# Patient Record
Sex: Female | Born: 1955
Health system: Southern US, Community
[De-identification: ages and names within clinical notes are randomized; demographics above are authoritative.]

## PROBLEM LIST (undated history)

## (undated) DIAGNOSIS — I10 Essential (primary) hypertension: Secondary | ICD-10-CM

## (undated) DIAGNOSIS — D649 Anemia, unspecified: Secondary | ICD-10-CM

## (undated) DIAGNOSIS — F419 Anxiety disorder, unspecified: Secondary | ICD-10-CM

## (undated) DIAGNOSIS — F17200 Nicotine dependence, unspecified, uncomplicated: Secondary | ICD-10-CM

## (undated) DIAGNOSIS — E785 Hyperlipidemia, unspecified: Secondary | ICD-10-CM

## (undated) HISTORY — DX: Anxiety disorder, unspecified: F41.9

## (undated) HISTORY — PX: TONSILLECTOMY AND ADENOIDECTOMY: SHX28

## (undated) HISTORY — DX: Nicotine dependence, unspecified, uncomplicated: F17.200

## (undated) HISTORY — PX: ABDOMINAL HYSTERECTOMY: SHX81

## (undated) HISTORY — PX: COLONOSCOPY: SHX174

## (undated) HISTORY — DX: Anemia, unspecified: D64.9

## (undated) HISTORY — DX: Hyperlipidemia, unspecified: E78.5

---

## 2002-02-07 ENCOUNTER — Other Ambulatory Visit: Admission: RE | Admit: 2002-02-07 | Discharge: 2002-02-07 | Payer: Self-pay | Admitting: Obstetrics and Gynecology

## 2002-02-14 ENCOUNTER — Encounter: Payer: Self-pay | Admitting: Obstetrics and Gynecology

## 2002-02-14 ENCOUNTER — Encounter: Admission: RE | Admit: 2002-02-14 | Discharge: 2002-02-14 | Payer: Self-pay | Admitting: Obstetrics and Gynecology

## 2002-02-16 ENCOUNTER — Encounter: Admission: RE | Admit: 2002-02-16 | Discharge: 2002-02-16 | Payer: Self-pay | Admitting: Obstetrics and Gynecology

## 2002-02-16 ENCOUNTER — Encounter: Payer: Self-pay | Admitting: Obstetrics and Gynecology

## 2003-03-09 ENCOUNTER — Encounter: Admission: RE | Admit: 2003-03-09 | Discharge: 2003-03-09 | Payer: Self-pay | Admitting: Obstetrics and Gynecology

## 2003-03-09 ENCOUNTER — Encounter: Payer: Self-pay | Admitting: Obstetrics and Gynecology

## 2003-03-27 ENCOUNTER — Other Ambulatory Visit: Admission: RE | Admit: 2003-03-27 | Discharge: 2003-03-27 | Payer: Self-pay | Admitting: Obstetrics and Gynecology

## 2003-05-08 ENCOUNTER — Encounter (INDEPENDENT_AMBULATORY_CARE_PROVIDER_SITE_OTHER): Payer: Self-pay | Admitting: *Deleted

## 2003-05-08 ENCOUNTER — Observation Stay (HOSPITAL_COMMUNITY): Admission: RE | Admit: 2003-05-08 | Discharge: 2003-05-09 | Payer: Self-pay | Admitting: Obstetrics and Gynecology

## 2004-03-27 ENCOUNTER — Encounter: Admission: RE | Admit: 2004-03-27 | Discharge: 2004-03-27 | Payer: Self-pay | Admitting: Obstetrics and Gynecology

## 2005-04-28 ENCOUNTER — Encounter: Admission: RE | Admit: 2005-04-28 | Discharge: 2005-04-28 | Payer: Self-pay | Admitting: Obstetrics and Gynecology

## 2006-05-03 ENCOUNTER — Encounter: Admission: RE | Admit: 2006-05-03 | Discharge: 2006-05-03 | Payer: Self-pay | Admitting: Obstetrics and Gynecology

## 2007-05-16 ENCOUNTER — Encounter: Admission: RE | Admit: 2007-05-16 | Discharge: 2007-05-16 | Payer: Self-pay | Admitting: Obstetrics and Gynecology

## 2008-06-25 ENCOUNTER — Encounter: Admission: RE | Admit: 2008-06-25 | Discharge: 2008-06-25 | Payer: Self-pay | Admitting: Obstetrics and Gynecology

## 2009-07-09 ENCOUNTER — Encounter: Admission: RE | Admit: 2009-07-09 | Discharge: 2009-07-09 | Payer: Self-pay | Admitting: Obstetrics and Gynecology

## 2010-09-15 ENCOUNTER — Other Ambulatory Visit: Payer: Self-pay | Admitting: Obstetrics and Gynecology

## 2010-09-15 DIAGNOSIS — Z1231 Encounter for screening mammogram for malignant neoplasm of breast: Secondary | ICD-10-CM

## 2010-09-16 ENCOUNTER — Ambulatory Visit
Admission: RE | Admit: 2010-09-16 | Discharge: 2010-09-16 | Disposition: A | Discharge status: Home / Self Care | Payer: PRIVATE HEALTH INSURANCE | Source: Ambulatory Visit | Attending: Obstetrics and Gynecology | Admitting: Obstetrics and Gynecology

## 2010-09-16 DIAGNOSIS — Z1231 Encounter for screening mammogram for malignant neoplasm of breast: Secondary | ICD-10-CM

## 2010-09-17 ENCOUNTER — Ambulatory Visit: Payer: Self-pay

## 2010-10-17 NOTE — H&P (Signed)
NAME:  Lindsay Collier, Lindsay Collier                          ACCOUNT NO.:  [id number]   MEDICAL RECORD NO.:  [id number]                   PATIENT TYPE:  OBV   LOCATION:  NA                                   FACILITY:  [facility]   PHYSICIAN:  Zenaida Niece, M.D.             DATE OF BIRTH:  1955/12/30   DATE OF ADMISSION:  05/08/2003  DATE OF DISCHARGE:                                HISTORY & PHYSICAL   CHIEF COMPLAINT:  Menometrorrhagia with dyspareunia and uterine leiomyomas.   HISTORY OF PRESENT ILLNESS:  This is a 55 year old black female para 1, 0-0-  0-1, whom I have been following  with annual examinations. At her most  recent  annual examination  in October of this year, she complained of  menses every 1 to 2 months that were slightly heavy with occasional  intramenstrual bleeding. She also has deep dyspareunia. On physical  examination her uterus was  upper limits of normal size, slightly irregular  and nontender. All surgical options were discussed  with the patient and she  wishes to proceed with definitive surgical therapy for her bleeding and  myomas. She did have an ultrasound last September which revealed 2 small  uterine myomas measuring up to 3 cm and physiologic ovarian cysts.   OBSTETRIC HISTORY:  Cesarean section at term with mechanical obstruction  due to leiomyomas, and she did have a myomectomy at that time as well.   PAST MEDICAL HISTORY:  Hypertension.   PAST SURGICAL HISTORY:  1. Tonsillectomy.  2. Cesarean section with myomectomy.   CURRENT MEDICATIONS:  HCTZ and Mavik.   ALLERGIES:  CODEINE.   SOCIAL HISTORY:  The patient is married and  does smoke greater than a pack  of  cigarettes a day.   FAMILY HISTORY:  The patient's father had colon cancer.   REVIEW OF SYSTEMS:  No urinary or bowel complaints.   PHYSICAL EXAMINATION:  GENERAL:  Weight 124 pounds. This is a well  developed, well nourished black female in no acute distress.  VITAL SIGNS:  Blood pressure  ranged from 110 to 140/70 to 90, pulse was 80.  NECK:  Supple without lymphadenopathy or thyromegaly.  LUNGS:  Clear to auscultation.  HEART:  Regular rate and rhythm without murmur.  ABDOMEN:  Soft, nontender, nondistended without palpable masses and she does  have a well healed transverse scar.  PELVIC:  External genitalia reveals no lesions. Speculum examination  reveals a normal cervix and Pap smear has returned normal. Bimanual  examination reveals a midplanar uterus which is upper  limits of normal  size, slightly irregular and fairly mobile.  EXTREMITIES:  No edema  and  nontender.   ASSESSMENT:  Symptomatic leiomyomatous uterus with menometrorrhagia and no  dyspareunia. All options have been discussed with the patient and she wished  to proceed with definitive surgical therapy. The risks of surgery including  bleeding, infection and damage to surrounding  organs have been discussed  with the patient as well as permanent sterility.   PLAN:  Admit the patient for laparoscopic assisted vaginal hysterectomy with  bilateral salpingo-oophorectomy and cystoscopy.                                               Zenaida Niece, M.D.    TDM/MEDQ  D:  05/07/2003  T:  05/07/2003  Job:  [id number]

## 2010-10-17 NOTE — Op Note (Signed)
NAME:  Lindsay Collier, Lindsay Collier                          ACCOUNT NO.:  [id number]   MEDICAL RECORD NO.:  [id number]                   PATIENT TYPE:  OBV   LOCATION:  9399                                 FACILITY:  [facility]   PHYSICIAN:  Zenaida Niece, M.D.             DATE OF BIRTH:  December 03, 1955   DATE OF PROCEDURE:  05/08/2003  DATE OF DISCHARGE:                                 OPERATIVE REPORT   PREOPERATIVE DIAGNOSIS:  Symptomatic leiomyomatous uterus.   POSTOPERATIVE DIAGNOSIS:  Symptomatic leiomyomatous uterus.   PROCEDURE:  Laparoscopically-assisted vaginal hysterectomy with bilateral  salpingo-oophorectomy and cystoscopy.   SURGEON:  Zenaida Niece, M.D.   ASSISTANT:  Huel Cote, M.D.   ANESTHESIA:  General endotracheal tube.   ESTIMATED BLOOD LOSS:  150 mL.   FINDINGS:  Enlarged uterus with multiple small myomas.  She had normal tubes  and ovaries.  She had a normal appendix, liver edge, and gallbladder.   PROCEDURE IN DETAIL:  The patient was taken to the operating room and placed  in the supine position.  General anesthesia was induced, and she was placed  in the mobile stirrups.  The abdomen, perineum, and vagina were prepped and  draped in the usual sterile fashion, bladder drained with a red rubber  catheter, and a Hulka tenaculum applied to the cervix for uterine  manipulation.  Infraumbilical skin was then infiltrated with 0.25% Marcaine  and a 1.5 cm horizontal incision was made.  The Veress needle was inserted  into the peritoneal cavity and placement confirmed by the water drop test  and opening pressure of 3 mmHg.  CO2 gas was insufflated to a pressure of  and the Veress needle was removed.  The 10/11 disposable trocar was  then introduced and placement confirmed by the laparoscope.  Five-millimeter  ports were placed on each side under direct visualization.  Inspection  revealed the above-mentioned findings, and the uterus was mobile with  several  small fibroids.  Using the tripolar device, each infundibulopelvic  ligament was coagulated with bipolar cautery and cut.  This was taken down  through the mesosalpinx, the round ligaments and broad ligaments to a level  just above the uterine artery.  This was done on both sides with adequate  hemostasis to free up tubes and ovaries and upper uterus.  From the left  side going across the anterior peritoneum to the right side, anterior  peritoneum was incised and the bladder pushed inferior.  All pedicles were  inspected and found to be hemostatic.   Attention was then turned vaginally.  The legs were elevated in the  stirrups.  She did have a fairly narrow vaginal canal.  Deaver retractors  were used and the anterior lip of the cervix was grasped with a Jacobs  tenaculum.  The cervicovaginal mucosa was infiltrated with a dilute solution  of Pitressin and incised circumferentially with electrocautery.  Using sharp  and blunt dissection the vagina was pushed off anteriorly and the anterior  peritoneum entered and the Deaver retractor used to retract the bladder  anterior.  Bowels were seen through this window.  The posterior cul-de-sac  was then grasped and entered sharply and a Bonnano speculum placed into the  posterior cul-de-sac.  The uterosacral ligaments were clamped, transected,  and ligated on each side with #1 chromic and tagged for later use.  Uterine  arteries were clamped, transected, and ligated on each side with #1 chromic.  Small pedicles were left, and these were clamped, transected, and ligated on  each side with 1 chromic and the uterus was removed.  The left tube and  ovary stayed attached to the uterus, but the right tube and ovary was  included in the prior pedicle.  It was then freed up and removed and sent as  a specimen as well.  Sutures were needed on each side to maintain hemostasis  and these were figure-of-eight sutures of #1 chromic.  The remainder of the   pedicles were hemostatic.  The uterosacral ligaments were plicated in the  midline with 2-0 silk.  The vagina was then closed in a vertical fashion  running locked 2-0 Vicryl.   Attention was then turned to cystoscopy.  The patient was given indigo  carmine IM.  A 70 degree cystoscope was inserted and after a couple of  minutes, indigo carmine was seen to come from each ureteral orifice without  difficulty.  There was no obvious injury to the bladder.  The cystoscope was  removed and a Foley catheter placed.  Legs were then taken down in the  stirrups.   Attention was turned back to laparoscopy.  The scrub tech and myself changed  gloves.  The laparoscope was reinserted and the gas allowed to inflate the  abdomen.  The pelvis was inspected and all pedicles found to be hemostatic.  The 5 mm ports were removed under direct visualization.  All gas was allowed  to deflate from the abdomen and then the 10/11 disposable trocar was then  removed.  The skin incisions were closed with interrupted sutures of 4-0  Vicryl, followed by Steri-Strips and Band-Aids.  The 5 mm port on the right  required through-and-through incision sutures to control a small amount of  bleeding.  The patient tolerated the procedure well and was taken to the  recovery room in stable condition.  Counts were correct x2, she was given  Ancef 1 g prior to the procedure, and she had PAS hose on throughout the  procedure.                                               Zenaida Niece, M.D.    TDM/MEDQ  D:  05/08/2003  T:  05/09/2003  Job:  [id number]

## 2010-10-21 ENCOUNTER — Ambulatory Visit (HOSPITAL_COMMUNITY)
Admission: RE | Admit: 2010-10-21 | Discharge: 2010-10-21 | Disposition: A | Discharge status: Home / Self Care | Payer: PRIVATE HEALTH INSURANCE | Source: Ambulatory Visit | Attending: Family Medicine | Admitting: Family Medicine

## 2010-10-21 ENCOUNTER — Other Ambulatory Visit: Payer: Self-pay | Admitting: Family Medicine

## 2010-10-21 ENCOUNTER — Encounter (HOSPITAL_COMMUNITY): Payer: Self-pay

## 2010-10-21 DIAGNOSIS — T148XXA Other injury of unspecified body region, initial encounter: Secondary | ICD-10-CM

## 2010-10-21 DIAGNOSIS — R55 Syncope and collapse: Secondary | ICD-10-CM | POA: Insufficient documentation

## 2010-10-21 HISTORY — DX: Essential (primary) hypertension: I10

## 2010-10-22 ENCOUNTER — Ambulatory Visit (HOSPITAL_COMMUNITY)
Admission: RE | Admit: 2010-10-22 | Discharge: 2010-10-22 | Disposition: A | Discharge status: Home / Self Care | Payer: PRIVATE HEALTH INSURANCE | Source: Ambulatory Visit | Attending: Family Medicine | Admitting: Family Medicine

## 2010-10-22 ENCOUNTER — Other Ambulatory Visit: Payer: Self-pay | Admitting: Family Medicine

## 2010-10-22 DIAGNOSIS — R42 Dizziness and giddiness: Secondary | ICD-10-CM | POA: Insufficient documentation

## 2010-10-22 DIAGNOSIS — R55 Syncope and collapse: Secondary | ICD-10-CM

## 2010-10-22 LAB — CREATININE, SERUM
Creatinine, Ser: 0.79 mg/dL (ref 0.4–1.2)
GFR calc non Af Amer: >60 mL/min (ref >60)

## 2010-10-22 MED ORDER — GADOBENATE DIMEGLUMINE 529 MG/ML IV SOLN
10.0000 mL | Freq: Once | INTRAVENOUS | Status: AC
  Administered 2010-10-22: 10 mL via INTRAVENOUS

## 2010-10-23 ENCOUNTER — Ambulatory Visit (HOSPITAL_COMMUNITY)
Admission: RE | Admit: 2010-10-23 | Discharge: 2010-10-23 | Disposition: A | Discharge status: Home / Self Care | Payer: PRIVATE HEALTH INSURANCE | Source: Ambulatory Visit | Attending: Family Medicine | Admitting: Family Medicine

## 2010-10-23 DIAGNOSIS — I6529 Occlusion and stenosis of unspecified carotid artery: Secondary | ICD-10-CM | POA: Insufficient documentation

## 2010-10-23 DIAGNOSIS — I10 Essential (primary) hypertension: Secondary | ICD-10-CM | POA: Insufficient documentation

## 2010-10-23 DIAGNOSIS — F17200 Nicotine dependence, unspecified, uncomplicated: Secondary | ICD-10-CM | POA: Insufficient documentation

## 2010-10-23 DIAGNOSIS — R42 Dizziness and giddiness: Secondary | ICD-10-CM | POA: Insufficient documentation

## 2010-10-23 DIAGNOSIS — R55 Syncope and collapse: Secondary | ICD-10-CM | POA: Insufficient documentation

## 2010-10-23 DIAGNOSIS — G459 Transient cerebral ischemic attack, unspecified: Secondary | ICD-10-CM

## 2010-12-15 ENCOUNTER — Other Ambulatory Visit: Payer: Self-pay | Admitting: Gastroenterology

## 2010-12-15 LAB — HM COLONOSCOPY

## 2011-08-10 ENCOUNTER — Other Ambulatory Visit: Payer: Self-pay | Admitting: Internal Medicine

## 2011-08-10 DIAGNOSIS — Z1231 Encounter for screening mammogram for malignant neoplasm of breast: Secondary | ICD-10-CM

## 2011-09-21 ENCOUNTER — Other Ambulatory Visit: Payer: Self-pay | Admitting: Physician Assistant

## 2011-09-22 NOTE — Telephone Encounter (Signed)
Then needs ov

## 2011-09-28 ENCOUNTER — Ambulatory Visit
Admission: RE | Admit: 2011-09-28 | Discharge: 2011-09-28 | Disposition: A | Discharge status: Home / Self Care | Payer: PRIVATE HEALTH INSURANCE | Source: Ambulatory Visit | Attending: Internal Medicine | Admitting: Internal Medicine

## 2011-09-28 DIAGNOSIS — Z1231 Encounter for screening mammogram for malignant neoplasm of breast: Secondary | ICD-10-CM

## 2011-09-30 ENCOUNTER — Ambulatory Visit (INDEPENDENT_AMBULATORY_CARE_PROVIDER_SITE_OTHER): Payer: PRIVATE HEALTH INSURANCE | Admitting: Physician Assistant

## 2011-09-30 ENCOUNTER — Encounter: Payer: Self-pay | Admitting: Physician Assistant

## 2011-09-30 VITALS — BP 120/74 | HR 76 | Temp 98.1°F | Resp 16 | Ht 63.5 in | Wt 115.6 lb

## 2011-09-30 DIAGNOSIS — Z01419 Encounter for gynecological examination (general) (routine) without abnormal findings: Secondary | ICD-10-CM

## 2011-09-30 DIAGNOSIS — I10 Essential (primary) hypertension: Secondary | ICD-10-CM | POA: Insufficient documentation

## 2011-09-30 DIAGNOSIS — F411 Generalized anxiety disorder: Secondary | ICD-10-CM

## 2011-09-30 DIAGNOSIS — E7800 Pure hypercholesterolemia, unspecified: Secondary | ICD-10-CM | POA: Insufficient documentation

## 2011-09-30 LAB — POCT URINALYSIS DIPSTICK
Bilirubin, UA: NEGATIVE
Ketones, UA: NEGATIVE
Protein, UA: NEGATIVE
Urobilinogen, UA: 0.2

## 2011-09-30 LAB — COMPREHENSIVE METABOLIC PANEL
AST: 25 U/L (ref 0–37)
Albumin: 4.4 g/dL (ref 3.5–5.2)
CO2: 27 mEq/L (ref 19–32)
Calcium: 10.1 mg/dL (ref 8.4–10.5)
Creat: 0.62 mg/dL (ref 0.50–1.10)
Glucose, Bld: 82 mg/dL (ref 70–99)
Sodium: 141 mEq/L (ref 135–145)

## 2011-09-30 LAB — LIPID PANEL
Cholesterol: 257 mg/dL — ABNORMAL HIGH (ref 0–200)
HDL: 45 mg/dL (ref >39)
Total CHOL/HDL Ratio: 5.7 Ratio
Triglycerides: 160 mg/dL — ABNORMAL HIGH (ref <150)
VLDL: 32 mg/dL (ref 0–40)

## 2011-09-30 LAB — CBC WITH DIFFERENTIAL/PLATELET
Basophils Relative: 1 % (ref 0–1)
Eosinophils Absolute: 0.2 10*3/uL (ref 0.0–0.7)
Eosinophils Relative: 2 % (ref 0–5)
HCT: 43.3 % (ref 36.0–46.0)
Hemoglobin: 14.4 g/dL (ref 12.0–15.0)
Lymphocytes Relative: 14 % (ref 12–46)
Lymphs Abs: 1.1 10*3/uL (ref 0.7–4.0)
MCV: 102.4 fL — ABNORMAL HIGH (ref 78.0–100.0)
Monocytes Absolute: 0.6 10*3/uL (ref 0.1–1.0)
Monocytes Relative: 8 % (ref 3–12)
RDW: 13.8 % (ref 11.5–15.5)
WBC: 7.5 10*3/uL (ref 4.0–10.5)

## 2011-09-30 MED ORDER — PAROXETINE HCL 20 MG PO TABS: 30.0000 mg | ORAL_TABLET | ORAL | Status: DC

## 2011-09-30 MED ORDER — ALPRAZOLAM 0.25 MG PO TABS: ORAL_TABLET | ORAL | Status: DC

## 2011-09-30 MED ORDER — LISINOPRIL-HYDROCHLOROTHIAZIDE 20-25 MG PO TABS: 1.0000 | ORAL_TABLET | Freq: Every day | ORAL | Status: DC

## 2011-09-30 NOTE — Progress Notes (Signed)
  Subjective:    Patient ID: Lindsay Collier, female    DOB: Oct 20, 1955, 56 y.o.   MRN: [id number]  HPI 56 yo AAF presents for pap smear, BP, cholesterol, anxiety check up.  Brings with her a log of blood pressures which are very well controlled.  She definitely is still experiencing anxiety daily when she works.  It is better since we have started the paxil, but she finds that she does better at work and can focus more if she takes an alprazolam in the mornings that she works. UTD on MMG and colonoscopy Review of Systems  All other systems reviewed and are negative.       Objective:   Physical Exam  Nursing note and vitals reviewed. Constitutional: She is oriented to person, place, and time. She appears well-developed and well-nourished.  HENT:  Head: Normocephalic and atraumatic.  Cardiovascular: Normal rate, regular rhythm and normal heart sounds.   Pulmonary/Chest: Effort normal. Right breast exhibits no inverted nipple, no mass, no nipple discharge, no skin change and no tenderness. Left breast exhibits no inverted nipple, no mass, no nipple discharge, no skin change and no tenderness. Breasts are symmetrical.  Abdominal: Soft. Bowel sounds are normal.  Genitourinary: Vagina normal.       No cervi s/p complete hysterectomy.  Bimanual unremarkable  Lymphadenopathy:    She has no axillary adenopathy.       Right: No supraclavicular adenopathy present.       Left: No supraclavicular adenopathy present.  Neurological: She is alert and oriented to person, place, and time.  Skin: Skin is warm and dry.  Psychiatric: She has a normal mood and affect. Her behavior is normal.   Results for orders placed in visit on 09/30/11  POCT URINALYSIS DIPSTICK      Component Value Range   Color, UA yellow     Clarity, UA clear     Glucose, UA neg     Bilirubin, UA neg     Ketones, UA neg     Spec Grav, UA 1.015     Blood, UA trace     pH, UA 7.0     Protein, UA neg     Urobilinogen, UA 0.2       Nitrite, UA neg     Leukocytes, UA Negative         Assessment & Plan:  Gyn exam-pap 1 sent htn-well controlled-continue current medication Anxiety-improved, but suboptimal control.  Increase paxil to 30mg  daily. This may help reduce the need for Xananx, but if not, we did discuss the r/b of daily xanax use/chemical dependence properties,etc and it is ok for her to use the morning dose daily and the afternoon dose daily prn.  She is to call me when the prescription is getting low.  She has been very responsible with her meds. If doing well, f/up in 6 months

## 2011-10-05 LAB — PAP IG (IMAGE GUIDED)

## 2011-10-07 ENCOUNTER — Encounter: Payer: Self-pay | Admitting: Family Medicine

## 2011-10-19 ENCOUNTER — Other Ambulatory Visit: Payer: Self-pay | Admitting: Physician Assistant

## 2011-10-21 ENCOUNTER — Other Ambulatory Visit: Payer: Self-pay | Admitting: Physician Assistant

## 2011-10-24 ENCOUNTER — Other Ambulatory Visit: Payer: Self-pay | Admitting: Physician Assistant

## 2011-11-21 ENCOUNTER — Other Ambulatory Visit: Payer: Self-pay | Admitting: Physician Assistant

## 2012-01-21 ENCOUNTER — Other Ambulatory Visit: Payer: Self-pay | Admitting: Family Medicine

## 2012-01-21 ENCOUNTER — Other Ambulatory Visit: Payer: Self-pay | Admitting: Physician Assistant

## 2012-01-21 MED ORDER — ALPRAZOLAM 0.25 MG PO TABS: ORAL_TABLET | ORAL | Status: DC

## 2012-04-14 ENCOUNTER — Telehealth: Payer: Self-pay

## 2012-04-14 ENCOUNTER — Other Ambulatory Visit: Payer: Self-pay | Admitting: Physician Assistant

## 2012-04-14 NOTE — Telephone Encounter (Signed)
PT STATES SHE HAVE BEEN TRYING TO GET HER ALPRAZOLAM REFILLED AND HASN'T HEARD ANYONE. PLEASE CALL [phone]     [facility] [address]

## 2012-04-14 NOTE — Telephone Encounter (Signed)
[facility] called [facility] [address]. Patient notified and voiced understanding.

## 2012-04-19 ENCOUNTER — Other Ambulatory Visit: Payer: Self-pay | Admitting: Physician Assistant

## 2012-04-20 ENCOUNTER — Other Ambulatory Visit: Payer: Self-pay | Admitting: Physician Assistant

## 2012-06-21 ENCOUNTER — Other Ambulatory Visit: Payer: Self-pay | Admitting: Physician Assistant

## 2012-06-23 ENCOUNTER — Other Ambulatory Visit: Payer: Self-pay | Admitting: Physician Assistant

## 2012-07-19 ENCOUNTER — Other Ambulatory Visit: Payer: Self-pay | Admitting: Physician Assistant

## 2012-07-24 ENCOUNTER — Other Ambulatory Visit: Payer: Self-pay | Admitting: Physician Assistant

## 2012-07-25 ENCOUNTER — Telehealth: Payer: Self-pay

## 2012-07-25 ENCOUNTER — Other Ambulatory Visit: Payer: Self-pay | Admitting: Physician Assistant

## 2012-07-25 NOTE — Telephone Encounter (Signed)
Both rx'es were sent on 07/24/12. If the pharmacy has not received them can we call them in.

## 2012-07-25 NOTE — Telephone Encounter (Signed)
Patient's husband states that [facility] refills of [facility]axil and lisinopril for patient a few days ago. They have not heard from [place] and patient is completely out of medication. I see requests in the mailbox if we could get to that as soon as possible per patient's request.

## 2012-08-15 ENCOUNTER — Other Ambulatory Visit: Payer: Self-pay | Admitting: Family Medicine

## 2012-08-15 ENCOUNTER — Other Ambulatory Visit: Payer: Self-pay | Admitting: Physician Assistant

## 2012-08-15 NOTE — Telephone Encounter (Signed)
Needs OV, second notice

## 2012-08-16 NOTE — Telephone Encounter (Signed)
Alprazolam refill called to pharmacy. 

## 2012-08-16 NOTE — Telephone Encounter (Signed)
Spoke w/pt to check when she plans to f/up because overdue according to OV notes. Pt stated she didn't realize she needed to f/up in 6 mos - thought it was just every year. She agreed to check calendar and CB to set up appt. Dr Audria Nine, do you want to RF alprazolam for anther month?

## 2012-08-31 ENCOUNTER — Encounter: Payer: PRIVATE HEALTH INSURANCE | Admitting: Physician Assistant

## 2012-09-10 ENCOUNTER — Ambulatory Visit (INDEPENDENT_AMBULATORY_CARE_PROVIDER_SITE_OTHER): Payer: PRIVATE HEALTH INSURANCE | Admitting: Physician Assistant

## 2012-09-10 ENCOUNTER — Other Ambulatory Visit: Payer: Self-pay | Admitting: Physician Assistant

## 2012-09-10 DIAGNOSIS — S139XXA Sprain of joints and ligaments of unspecified parts of neck, initial encounter: Secondary | ICD-10-CM

## 2012-09-10 DIAGNOSIS — S335XXA Sprain of ligaments of lumbar spine, initial encounter: Secondary | ICD-10-CM

## 2012-09-10 DIAGNOSIS — R51 Headache: Secondary | ICD-10-CM

## 2012-09-10 MED ORDER — LISINOPRIL-HYDROCHLOROTHIAZIDE 20-25 MG PO TABS: 1.0000 | ORAL_TABLET | Freq: Every day | ORAL | Status: DC

## 2012-09-10 MED ORDER — METHOCARBAMOL 500 MG PO TABS: 500.0000 mg | ORAL_TABLET | Freq: Three times a day (TID) | ORAL | Status: DC

## 2012-09-10 MED ORDER — NAPROXEN 500 MG PO TABS: 500.0000 mg | ORAL_TABLET | Freq: Two times a day (BID) | ORAL | Status: DC

## 2012-09-10 MED ORDER — PAROXETINE HCL 20 MG PO TABS: 20.0000 mg | ORAL_TABLET | Freq: Once | ORAL | Status: DC
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
## 2012-09-18 ENCOUNTER — Other Ambulatory Visit: Payer: Self-pay | Admitting: Physician Assistant

## 2012-09-22 ENCOUNTER — Encounter: Payer: Self-pay | Admitting: Physician Assistant

## 2012-09-22 ENCOUNTER — Ambulatory Visit (INDEPENDENT_AMBULATORY_CARE_PROVIDER_SITE_OTHER): Payer: PRIVATE HEALTH INSURANCE | Admitting: Physician Assistant

## 2012-09-22 VITALS — BP 132/76 | HR 84 | Temp 99.0°F | Resp 16 | Ht 63.0 in | Wt 127.0 lb

## 2012-09-22 DIAGNOSIS — F341 Dysthymic disorder: Secondary | ICD-10-CM

## 2012-09-22 DIAGNOSIS — F419 Anxiety disorder, unspecified: Secondary | ICD-10-CM

## 2012-09-22 DIAGNOSIS — F329 Major depressive disorder, single episode, unspecified: Secondary | ICD-10-CM | POA: Insufficient documentation

## 2012-09-22 DIAGNOSIS — F32A Depression, unspecified: Secondary | ICD-10-CM | POA: Insufficient documentation

## 2012-09-22 DIAGNOSIS — E7800 Pure hypercholesterolemia, unspecified: Secondary | ICD-10-CM

## 2012-09-22 DIAGNOSIS — I10 Essential (primary) hypertension: Secondary | ICD-10-CM

## 2012-09-22 LAB — CBC WITH DIFFERENTIAL/PLATELET
Basophils Absolute: 0 10*3/uL (ref 0.0–0.1)
Basophils Relative: 1 % (ref 0–1)
Eosinophils Relative: 2 % (ref 0–5)
HCT: 39 % (ref 36.0–46.0)
MCHC: 34.9 g/dL (ref 30.0–36.0)
MCV: 97.5 fL (ref 78.0–100.0)
Monocytes Absolute: 0.6 10*3/uL (ref 0.1–1.0)
RDW: 13.5 % (ref 11.5–15.5)

## 2012-09-22 LAB — COMPREHENSIVE METABOLIC PANEL
BUN: 13 mg/dL (ref 6–23)
CO2: 23 mEq/L (ref 19–32)
Calcium: 10.1 mg/dL (ref 8.4–10.5)
Chloride: 101 mEq/L (ref 96–112)
Creat: 0.65 mg/dL (ref 0.50–1.10)

## 2012-09-22 LAB — LIPID PANEL
Cholesterol: 248 mg/dL — ABNORMAL HIGH (ref 0–200)
HDL: 48 mg/dL (ref >39)
Total CHOL/HDL Ratio: 5.2 Ratio

## 2012-09-22 MED ORDER — ALPRAZOLAM 0.25 MG PO TABS: 0.2500 mg | ORAL_TABLET | Freq: Two times a day (BID) | ORAL | Status: DC | PRN

## 2012-09-22 NOTE — Patient Instructions (Addendum)
Please keep working on cutting back on smoking!

## 2012-09-22 NOTE — Progress Notes (Signed)
Subjective:    Patient ID: Lindsay Collier, female    DOB: 1955-12-06, 57 y.o.   MRN: [id number]  HPI This 57 y.o. female presents for refill of alprazolam.  She also takes paroxetine. Previously followed by Lindsay Co, PA-C. She is due for a health maintenance exam, but has fasted today.   Past Medical History  Diagnosis Date  . Hypertension   . Hyperlipidemia   . Anxiety   . Smoker     Past Surgical History  Procedure Laterality Date  . Cesarean section    . Abdominal hysterectomy      total  . Tonsillectomy and adenoidectomy    . Colonoscopy      2012   Current outpatient prescriptions: ALPRAZolam (XANAX) 0.25 MG tablet, Take 1 tablet (0.25 mg total) by mouth 2 (two) times daily as needed for anxiety., Disp: 60 tablet, Rfl: 0;   cetirizine (ZYRTEC) 10 MG tablet, Take 10 mg by mouth daily.   ferrous sulfate 325 (65 FE) MG tablet, Take 325 mg by mouth daily with breakfast.   fish oil-omega-3 fatty acids 1000 MG capsule, Take 2 g by mouth daily.  lisinopril-hydrochlorothiazide (PRINZIDE,ZESTORETIC) 20-25 MG per tablet, Take 1 tablet by mouth daily.   OVER THE COUNTER MEDICATION, Magnesium 250mg ;   OVER THE COUNTER MEDICATION, Vit D-3 1000iu  OVER THE COUNTER MEDICATION, Aloe Vera juice once daily  PARoxetine (PAXIL) 20 MG tablet, Take 1 tablet (20 mg total) by mouth once., Disp: 90 tablet, Rfl: 1    Allergies  Allergen Reactions  . Codeine     Excessive sweating    History   Social History  . Marital Status: Married    Spouse Name: Lindsay Collier    Number of Children: 1  . Years of Education: 12+   Occupational History  . [employer]     [employer]   Social History Main Topics  . Smoking status: Current Every Day Smoker    Types: Cigarettes  . Smokeless tobacco: Never Used     Comment: cutting back  . Alcohol Use: No  . Drug Use: No  . Sexually Active: Yes -- Female partner(s)    Birth Control/ Protection: Surgical   Social History Narrative   Lives with  her husband and their son.    Family History  Problem Relation Age of Onset  . Hypertension Father   . Heart disease Father   . Cancer Father     Prostate    Review of Systems No chest pain, SOB, HA, dizziness, vision change, N/V, diarrhea, constipation, dysuria, urinary urgency or frequency, myalgias, arthralgias or rash.     Objective:   Physical Exam Blood pressure 132/76, pulse 84, temperature 99 F (37.2 C), temperature source Oral, resp. rate 16, height 5\' 3"  (1.6 m), weight 127 lb (57.607 kg), last menstrual period 06/01/2006, SpO2 98.00%. Body mass index is 22.5 kg/(m^2). Well-developed, well nourished BF who is awake, alert and oriented, in NAD. HEENT: [place]/AT, sclera and conjunctiva are clear.   Neck: supple, non-tender, no lymphadenopathy, thyromegaly. Heart: RRR, no murmur Lungs: normal effort, CTA Extremities: no cyanosis, clubbing or edema. Skin: warm and dry without rash. Psychologic: good mood and appropriate affect, normal speech and behavior.     Assessment & Plan:  Anxiety and depression - Plan: ALPRAZolam (XANAX) 0.25 MG tablet  HTN (hypertension) - Plan: Comprehensive metabolic panel, TSH, CBC with Differential  Hypercholesterolemia - Plan: Comprehensive metabolic panel, Lipid panel  Schedule CPE at her convenience in the next  1-2 months.  Fernande Bras, PA-C Physician Assistant-Certified [facility]

## 2012-09-23 ENCOUNTER — Encounter: Payer: Self-pay | Admitting: Family Medicine

## 2012-09-26 ENCOUNTER — Other Ambulatory Visit: Payer: Self-pay | Admitting: Physician Assistant

## 2012-09-26 MED ORDER — SIMVASTATIN 20 MG PO TABS: 20.0000 mg | ORAL_TABLET | Freq: Every evening | ORAL | Status: DC

## 2012-09-26 NOTE — Addendum Note (Signed)
Addended by: Gerrianne Scale on: 09/26/2012 03:38 PM   Modules accepted: Orders

## 2012-09-28 ENCOUNTER — Other Ambulatory Visit: Payer: Self-pay

## 2012-09-28 DIAGNOSIS — Z1231 Encounter for screening mammogram for malignant neoplasm of breast: Secondary | ICD-10-CM

## 2012-10-04 ENCOUNTER — Ambulatory Visit (INDEPENDENT_AMBULATORY_CARE_PROVIDER_SITE_OTHER): Payer: PRIVATE HEALTH INSURANCE | Admitting: Physician Assistant

## 2012-10-04 ENCOUNTER — Ambulatory Visit
Admission: RE | Admit: 2012-10-04 | Discharge: 2012-10-04 | Disposition: A | Discharge status: Home / Self Care | Payer: PRIVATE HEALTH INSURANCE | Source: Ambulatory Visit

## 2012-10-04 ENCOUNTER — Encounter: Payer: Self-pay | Admitting: Physician Assistant

## 2012-10-04 VITALS — BP 120/78 | HR 69 | Temp 97.9°F | Resp 16 | Ht 63.5 in | Wt 125.0 lb

## 2012-10-04 DIAGNOSIS — Z1211 Encounter for screening for malignant neoplasm of colon: Secondary | ICD-10-CM

## 2012-10-04 DIAGNOSIS — Z139 Encounter for screening, unspecified: Secondary | ICD-10-CM

## 2012-10-04 DIAGNOSIS — Z0000 Encounter for general adult medical examination without abnormal findings: Secondary | ICD-10-CM

## 2012-10-04 DIAGNOSIS — Z1231 Encounter for screening mammogram for malignant neoplasm of breast: Secondary | ICD-10-CM

## 2012-10-04 LAB — POCT UA - MICROSCOPIC ONLY
Casts, Ur, LPF, POC: NEGATIVE
Yeast, UA: NEGATIVE

## 2012-10-04 LAB — IFOBT (OCCULT BLOOD): IFOBT: NEGATIVE

## 2012-10-04 NOTE — Patient Instructions (Signed)

## 2012-10-04 NOTE — Progress Notes (Addendum)
Subjective:    Patient ID: Lindsay Collier, female    DOB: 27-Feb-1956, 57 y.o.   MRN: [id number]  HPI  This 57 y.o. female presents for Annual Wellness Exam. Mammogram today. Just had fasting labs done, cholesterol is elevated.  Started on statin, tolerating well.  Will repeat labs in 3 months.   Past Medical History  Diagnosis Date  . Hypertension   . Hyperlipidemia   . Anxiety   . Smoker   . Anemia     Past Surgical History  Procedure Laterality Date  . Cesarean section    . Abdominal hysterectomy      total  . Tonsillectomy and adenoidectomy    . Colonoscopy      2012    Prior to Admission medications   Medication Sig Start Date End Date Taking? Authorizing Provider  ALPRAZolam (XANAX) 0.25 MG tablet Take 1 tablet (0.25 mg total) by mouth 2 (two) times daily as needed for anxiety. 09/22/12  Yes Neyla Gauntt S Fin Hupp, PA-C  cetirizine (ZYRTEC) 10 MG tablet Take 10 mg by mouth daily.   Yes Historical Provider, MD  ferrous sulfate 325 (65 FE) MG tablet Take 325 mg by mouth daily with breakfast.   Yes Historical Provider, MD  fish oil-omega-3 fatty acids 1000 MG capsule Take 2 g by mouth daily.   Yes Historical Provider, MD  lisinopril-hydrochlorothiazide (PRINZIDE,ZESTORETIC) 20-25 MG per tablet Take 1 tablet by mouth daily.   Yes Historical Provider, MD  OVER THE COUNTER MEDICATION Magnesium 250mg    Yes Historical Provider, MD  OVER THE COUNTER MEDICATION Vit D-3 1000iu   Yes Historical Provider, MD  OVER THE COUNTER MEDICATION Aloe Vera juice once daily   Yes Historical Provider, MD  PARoxetine (PAXIL) 20 MG tablet Take 1 tablet (20 mg total) by mouth once. 09/10/12  Yes Marzella Schlein McClung, PA-C  simvastatin (ZOCOR) 20 MG tablet Take 1 tablet (20 mg total) by mouth every evening. 09/26/12  Yes Abhi Moccia Tessa Lerner, PA-C    Allergies  Allergen Reactions  . Codeine     Excessive sweating    History   Social History  . Marital Status: Married    Spouse Name: [name]    Number of  Children: 1  . Years of Education: 12+   Occupational History  . Dietary [employer]     [employer]   Social History Main Topics  . Smoking status: Current Every Day Smoker    Types: Cigarettes  . Smokeless tobacco: Never Used     Comment: cutting back  . Alcohol Use: No  . Drug Use: No  . Sexually Active: Yes -- Female partner(s)    Birth Control/ Protection: Surgical   Other Topics Concern  . Not on file   Social History Narrative   Lives with her husband and their son.    Family History  Problem Relation Age of Onset  . Hypertension Father   . Heart disease Father   . Cancer Father     Prostate  . Hypertension Mother    Review of Systems  Constitutional: Negative.   HENT: Negative.   Eyes: Negative.   Respiratory: Negative.   Cardiovascular: Negative.   Gastrointestinal: Negative.   Endocrine: Negative.   Genitourinary: Negative.   Musculoskeletal: Negative.   Skin: Negative.   Allergic/Immunologic: Negative.   Neurological: Negative.   Hematological: Negative.   Psychiatric/Behavioral: Negative.        She's feeling a lot better with paroxetine and prn alprazolam.  Objective:   Physical Exam  Vitals reviewed. Constitutional: She is oriented to person, place, and time. Vital signs are normal. She appears well-developed and well-nourished. She is active and cooperative. No distress.  HENT:  Head: Normocephalic and atraumatic.  Right Ear: Hearing, tympanic membrane, external ear and ear canal normal. No foreign bodies.  Left Ear: Hearing, tympanic membrane, external ear and ear canal normal. No foreign bodies.  Nose: Nose normal.  Mouth/Throat: Uvula is midline, oropharynx is clear and moist and mucous membranes are normal. She has dentures (fully compensated edentula). No oral lesions. Normal dentition. No dental abscesses or edematous. No oropharyngeal exudate.  Eyes: Conjunctivae, EOM and lids are normal. Pupils are equal, round, and reactive to light.  Right eye exhibits no discharge. Left eye exhibits no discharge. No scleral icterus.  Fundoscopic exam:      The right eye shows no arteriolar narrowing, no AV nicking, no exudate, no hemorrhage and no papilledema.       The left eye shows no arteriolar narrowing, no AV nicking, no exudate, no hemorrhage and no papilledema.  Neck: Trachea normal, normal range of motion and full passive range of motion without pain. Neck supple. No spinous process tenderness and no muscular tenderness present. No mass and no thyromegaly present.  Cardiovascular: Normal rate, regular rhythm, normal heart sounds, intact distal pulses and normal pulses.   Pulmonary/Chest: Effort normal and breath sounds normal. She exhibits no tenderness and no retraction. Right breast exhibits no inverted nipple, no mass, no nipple discharge, no skin change and no tenderness. Left breast exhibits no inverted nipple, no mass, no nipple discharge, no skin change and no tenderness. Breasts are symmetrical.  Abdominal: Soft. Normal appearance and bowel sounds are normal. She exhibits no distension and no mass. There is no hepatosplenomegaly. There is no tenderness. There is no rigidity, no rebound, no guarding, no CVA tenderness, no tenderness at McBurney's point and negative Murphy's sign. No hernia. Hernia confirmed negative in the right inguinal area and confirmed negative in the left inguinal area.  Genitourinary: Rectum normal, vagina normal and uterus normal. Rectal exam shows no external hemorrhoid and no fissure. No breast swelling, tenderness, discharge or bleeding. Pelvic exam was performed with patient supine. No labial fusion. There is no rash, tenderness, lesion or injury on the right labia. There is no rash, tenderness, lesion or injury on the left labia. Right adnexum displays no mass, no tenderness and no fullness. Left adnexum displays no mass, no tenderness and no fullness. No erythema, tenderness or bleeding around the vagina. No  foreign body around the vagina. No signs of injury around the vagina. No vaginal discharge found.  Musculoskeletal: She exhibits no edema and no tenderness.       Cervical back: Normal.       Thoracic back: Normal.       Lumbar back: Normal.  Lymphadenopathy:       Head (right side): No tonsillar, no preauricular, no posterior auricular and no occipital adenopathy present.       Head (left side): No tonsillar, no preauricular, no posterior auricular and no occipital adenopathy present.    She has no cervical adenopathy.    She has no axillary adenopathy.       Right: No inguinal and no supraclavicular adenopathy present.       Left: No inguinal and no supraclavicular adenopathy present.  Neurological: She is alert and oriented to person, place, and time. She has normal strength and normal reflexes. No cranial  nerve deficit. She exhibits normal muscle tone. Coordination and gait normal.  Skin: Skin is warm, dry and intact. No rash noted. She is not diaphoretic. No cyanosis or erythema. Nails show no clubbing.  Psychiatric: She has a normal mood and affect. Her speech is normal and behavior is normal. Judgment and thought content normal.   Results for orders placed in visit on 10/04/12  IFOBT (OCCULT BLOOD)      Result Value Range   IFOBT Negative    POCT UA - MICROSCOPIC ONLY      Result Value Range   WBC, Ur, HPF, POC 0-1     RBC, urine, microscopic 2-5     Bacteria, U Microscopic 1+     Mucus, UA pos     Epithelial cells, urine per micros 1-2     Crystals, Ur, HPF, POC neg     Casts, Ur, LPF, POC neg     Yeast, UA neg        Assessment & Plan:  Routine general medical examination at a health care facility - Plan: POCT UA - Microscopic; Age appropriate anticipatory guidance provided.  Screening for colon cancer - Plan: IFOBT POC (occult bld, rslt in office)  RTC in 3 months for follow-up fasting labs to reassess hyperlipidemia and anxiety/depression.  Fernande Bras,  PA-C Physician Assistant-Certified [facility]    Fernande Bras, PA-C Physician Assistant-Certified [facility]

## 2012-10-16 ENCOUNTER — Other Ambulatory Visit: Payer: Self-pay | Admitting: Physician Assistant

## 2012-10-16 ENCOUNTER — Other Ambulatory Visit: Payer: Self-pay | Admitting: Family Medicine

## 2012-10-19 ENCOUNTER — Other Ambulatory Visit: Payer: Self-pay | Admitting: Physician Assistant

## 2012-10-19 NOTE — Telephone Encounter (Signed)
I RFd the paroxetine, but am forwarding this to you Chelle to address req for alprazolam.

## 2012-10-19 NOTE — Telephone Encounter (Signed)
Pt is needing 2 Refills on medication she states that she about out she said it was for Teroxetne and Alpraxonlm (thats how she spelt them to me) Call back number [phone]

## 2012-10-21 ENCOUNTER — Telehealth: Payer: Self-pay

## 2012-10-21 DIAGNOSIS — F329 Major depressive disorder, single episode, unspecified: Secondary | ICD-10-CM

## 2012-10-21 DIAGNOSIS — F32A Depression, unspecified: Secondary | ICD-10-CM

## 2012-10-21 NOTE — Telephone Encounter (Signed)
Called to advise this is done. Unfortunately her mailbox is not set up yet.

## 2012-10-21 NOTE — Telephone Encounter (Signed)
Rx printed

## 2012-10-21 NOTE — Telephone Encounter (Signed)
Pt is out of alprazolam - pt requested through pharmacy on Sunday and they haven't heart back.  Please advise.   [facility]: [facility] [address]  [address]

## 2012-10-21 NOTE — Telephone Encounter (Signed)
Please advise on the alprazolam

## 2012-10-24 ENCOUNTER — Other Ambulatory Visit: Payer: Self-pay | Admitting: Family Medicine

## 2012-10-26 ENCOUNTER — Encounter: Payer: Self-pay | Admitting: Family Medicine

## 2012-12-12 ENCOUNTER — Other Ambulatory Visit: Payer: Self-pay | Admitting: Physician Assistant

## 2012-12-13 NOTE — Telephone Encounter (Signed)
Please remind pt that she will need an OV next month per Chelles note. Rx is ready.

## 2012-12-14 NOTE — Telephone Encounter (Signed)
Notified pt who agreed.

## 2012-12-19 ENCOUNTER — Other Ambulatory Visit: Payer: Self-pay | Admitting: Physician Assistant

## 2013-01-18 ENCOUNTER — Other Ambulatory Visit: Payer: Self-pay | Admitting: Physician Assistant

## 2013-02-06 ENCOUNTER — Other Ambulatory Visit: Payer: Self-pay | Admitting: Physician Assistant

## 2013-02-06 NOTE — Telephone Encounter (Signed)
Lindsay Collier - Patient needs a refill on her xanax.  [phone]

## 2013-02-07 NOTE — Telephone Encounter (Signed)
Chelle please address.

## 2013-03-19 ENCOUNTER — Other Ambulatory Visit: Payer: Self-pay | Admitting: Physician Assistant

## 2013-04-03 ENCOUNTER — Other Ambulatory Visit: Payer: Self-pay | Admitting: Physician Assistant

## 2013-04-06 ENCOUNTER — Telehealth: Payer: Self-pay

## 2013-04-06 ENCOUNTER — Other Ambulatory Visit: Payer: Self-pay | Admitting: Physician Assistant

## 2013-04-06 NOTE — Telephone Encounter (Signed)
Chelle, I denied RF req on 04/03/13 d/t pt overdue for f/up (at 09/2012 OV you instr'd to RTC in 3 mos) and your note on last RF that OV was needed for more. Pt's husband called this morning to check status and agreed that pt could come in on Monday to see you at 102. Pt is out of med and I advised husband I would ask you if you could send in a few tablets to give pt time to get in.

## 2013-04-06 NOTE — Telephone Encounter (Signed)
RX for XANAX 0.25 faxed to [facility] on [address].

## 2013-04-10 ENCOUNTER — Ambulatory Visit (INDEPENDENT_AMBULATORY_CARE_PROVIDER_SITE_OTHER): Payer: PRIVATE HEALTH INSURANCE | Admitting: Physician Assistant

## 2013-04-10 VITALS — BP 128/76 | HR 69 | Temp 97.9°F | Resp 16 | Ht 63.5 in | Wt 120.2 lb

## 2013-04-10 DIAGNOSIS — F32A Depression, unspecified: Secondary | ICD-10-CM

## 2013-04-10 DIAGNOSIS — E7800 Pure hypercholesterolemia, unspecified: Secondary | ICD-10-CM

## 2013-04-10 DIAGNOSIS — F341 Dysthymic disorder: Secondary | ICD-10-CM

## 2013-04-10 DIAGNOSIS — I10 Essential (primary) hypertension: Secondary | ICD-10-CM

## 2013-04-10 DIAGNOSIS — F329 Major depressive disorder, single episode, unspecified: Secondary | ICD-10-CM

## 2013-04-10 LAB — POCT CBC
HCT, POC: 46.3 % (ref 37.7–47.9)
Lymph, poc: 1.4 (ref 0.6–3.4)
MCH, POC: 33.7 pg — AB (ref 27–31.2)
MCHC: 31.3 g/dL — AB (ref 31.8–35.4)
MCV: 107.6 fL — AB (ref 80–97)
MID (cbc): 0.4 (ref 0–0.9)
MPV: 9.3 fL (ref 0–99.8)
POC LYMPH PERCENT: 25.4 %L (ref 10–50)
POC MID %: 6.7 %M (ref 0–12)
Platelet Count, POC: 261 10*3/uL (ref 142–424)
RBC: 4.3 M/uL (ref 4.04–5.48)
RDW, POC: 13.4 %
WBC: 5.4 10*3/uL (ref 4.6–10.2)

## 2013-04-10 LAB — LIPID PANEL
HDL: 50 mg/dL (ref >39)
LDL Cholesterol: 157 mg/dL — ABNORMAL HIGH (ref 0–99)

## 2013-04-10 LAB — COMPREHENSIVE METABOLIC PANEL
ALT: 11 U/L (ref 0–35)
Albumin: 4.3 g/dL (ref 3.5–5.2)
CO2: 30 mEq/L (ref 19–32)
Calcium: 10 mg/dL (ref 8.4–10.5)
Chloride: 103 mEq/L (ref 96–112)
Creat: 0.78 mg/dL (ref 0.50–1.10)
Potassium: 4.5 mEq/L (ref 3.5–5.3)
Sodium: 139 mEq/L (ref 135–145)
Total Bilirubin: 0.3 mg/dL (ref 0.3–1.2)
Total Protein: 7.1 g/dL (ref 6.0–8.3)

## 2013-04-10 MED ORDER — PAROXETINE HCL 20 MG PO TABS: 20.0000 mg | ORAL_TABLET | ORAL | Status: DC

## 2013-04-10 MED ORDER — ALPRAZOLAM 0.25 MG PO TABS: 0.2500 mg | ORAL_TABLET | ORAL | Status: DC | PRN

## 2013-04-10 MED ORDER — LISINOPRIL-HYDROCHLOROTHIAZIDE 20-25 MG PO TABS: 1.0000 | ORAL_TABLET | Freq: Every day | ORAL | Status: DC

## 2013-04-10 NOTE — Progress Notes (Signed)
I have examined this patient along with the student and agree.  

## 2013-04-10 NOTE — Patient Instructions (Signed)
You can have your next appointment in 6 months from today Continue to get plenty of exercise at least 150 minutes exercise/day Try to limit fatty foods in your diet and be sure to eat plenty of fruits and vegetables

## 2013-04-10 NOTE — Progress Notes (Signed)
  Subjective:    Patient ID: Lindsay Collier, female    DOB: 11/07/1955, 57 y.o.   MRN: [id number]  HPI  Lindsay Collier is a 57 YO female with a history of HTN, hypercholesterolemia, and Anxiety who presents today for medication refill and follow-up on these issues.  She is in need of a re-fill on Xanax, Lisinopril-HCTZ, and Paxil specifically.  The patient checks her blood pressures at home a few times/week and have been between 110-120/70-80.  She states she does eat fatty foods on a regular basis but knows she should eat these in moderation.  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  She denies any chest pain, leg swelling/edema, fatigue, or shortness of breath.   Lindsay Collier states her anxiety has been stable except for rare situations mostly at work when she gets overwhelmed 2-3 times/week.  She uses .25 mg Xanax as needed 2-3 times a week and 20 mg Paxil daily.    The patient has a history of hypercholesterolemia with most recent labs 6 months ago and is fasting today.  She is currently not on any treatment to control her cholesterol besides healthy lifestyle choices.     Past Medical History  Diagnosis Date  . Hypertension   . Hyperlipidemia   . Anxiety   . Smoker   . Anemia      Review of Systems  Constitutional: Negative for fever, chills and fatigue.  Respiratory: Negative for cough, chest tightness and shortness of breath.   Cardiovascular: Negative for chest pain, palpitations and leg swelling.  Gastrointestinal: Negative for abdominal pain.  Psychiatric/Behavioral: The patient is nervous/anxious.        Objective:   Physical Exam Lindsay Collier  is a pleasant well-developed, well-nourished African American female in no acute distress Heart: Normal rate and rhythm, no murmurs, rubs, or gallops Lungs: Clear to auscultation bilaterally, no wheezing, rhonchi, or rales Extremities: No cyanosis or edema noted EENT: External ear and ear canals  clear with no erythema noted and TM visible bilaterally.  Nasal mucosa non-erythematous and non-swollen.  Oral pharynx non-erythematous with no tonsillar swelling or exudate.  Neck supple with no cervical lymphadenopathy present bilaterally.  Frontal sinuses non- tender to palpation.    BP 128/76  Pulse 69  Temp(Src) 97.9 F (36.6 C) (Oral)  Resp 16  Ht 5' 3.5" (1.613 m)  Wt 120 lb 3.2 oz (54.522 kg)  BMI 20.96 kg/m2  SpO2 98%  LMP 06/01/2006      Assessment & Plan:  Anxiety and depression - Plan: Continue current plan: ALPRAZolam (XANAX) 0.25 MG tablet as needed, PARoxetine (PAXIL) 20 MG tablet daily  HTN (hypertension) - Plan: Continue current plan: POCT CBC, Comprehensive metabolic panel, lisinopril-hydrochlorothiazide (PRINZIDE,ZESTORETIC) 20-25 MG per tablet daily.  Home blood pressure monitoring once/ 2 weeks  Hypercholesterolemia - Plan: Continue current plan: Lipid panel and continue healthy diet and exercise  Patient Instructions  You can have your next appointment in 6 months from today Continue to get plenty of exercise at least 150 minutes exercise/day Try to limit fatty foods in your diet and be sure to eat plenty of fruits and vegetables

## 2013-05-29 ENCOUNTER — Other Ambulatory Visit: Payer: Self-pay | Admitting: Physician Assistant

## 2013-05-30 ENCOUNTER — Other Ambulatory Visit: Payer: Self-pay

## 2013-07-14 ENCOUNTER — Other Ambulatory Visit: Payer: Self-pay | Admitting: Physician Assistant

## 2013-07-26 ENCOUNTER — Other Ambulatory Visit: Payer: Self-pay | Admitting: Physician Assistant

## 2013-07-27 NOTE — Telephone Encounter (Signed)
faxed

## 2013-09-21 ENCOUNTER — Other Ambulatory Visit: Payer: Self-pay | Admitting: Physician Assistant

## 2013-10-16 ENCOUNTER — Other Ambulatory Visit: Payer: Self-pay | Admitting: Physician Assistant

## 2013-11-20 ENCOUNTER — Ambulatory Visit (INDEPENDENT_AMBULATORY_CARE_PROVIDER_SITE_OTHER): Payer: PRIVATE HEALTH INSURANCE | Admitting: Physician Assistant

## 2013-11-20 ENCOUNTER — Other Ambulatory Visit: Payer: Self-pay | Admitting: Physician Assistant

## 2013-11-20 VITALS — BP 122/68 | HR 77 | Temp 98.1°F | Resp 16 | Ht 63.0 in | Wt 112.2 lb

## 2013-11-20 DIAGNOSIS — R413 Other amnesia: Secondary | ICD-10-CM

## 2013-11-20 DIAGNOSIS — F341 Dysthymic disorder: Secondary | ICD-10-CM

## 2013-11-20 DIAGNOSIS — F419 Anxiety disorder, unspecified: Secondary | ICD-10-CM

## 2013-11-20 DIAGNOSIS — F32A Depression, unspecified: Secondary | ICD-10-CM

## 2013-11-20 DIAGNOSIS — Z0000 Encounter for general adult medical examination without abnormal findings: Secondary | ICD-10-CM

## 2013-11-20 DIAGNOSIS — Z23 Encounter for immunization: Secondary | ICD-10-CM

## 2013-11-20 DIAGNOSIS — I10 Essential (primary) hypertension: Secondary | ICD-10-CM

## 2013-11-20 DIAGNOSIS — E7800 Pure hypercholesterolemia, unspecified: Secondary | ICD-10-CM

## 2013-11-20 DIAGNOSIS — Z1159 Encounter for screening for other viral diseases: Secondary | ICD-10-CM

## 2013-11-20 DIAGNOSIS — F329 Major depressive disorder, single episode, unspecified: Secondary | ICD-10-CM

## 2013-11-20 LAB — COMPREHENSIVE METABOLIC PANEL
ALBUMIN: 4.5 g/dL (ref 3.5–5.2)
ALK PHOS: 71 U/L (ref 39–117)
ALT: 9 U/L (ref 0–35)
AST: 22 U/L (ref 0–37)
BUN: 14 mg/dL (ref 6–23)
CALCIUM: 10 mg/dL (ref 8.4–10.5)
CHLORIDE: 100 meq/L (ref 96–112)
CO2: 23 meq/L (ref 19–32)
Creat: 0.85 mg/dL (ref 0.50–1.10)
GLUCOSE: 98 mg/dL (ref 70–99)
Potassium: 4 mEq/L (ref 3.5–5.3)
SODIUM: 135 meq/L (ref 135–145)
TOTAL PROTEIN: 7.6 g/dL (ref 6.0–8.3)
Total Bilirubin: 0.2 mg/dL (ref 0.2–1.2)

## 2013-11-20 LAB — LIPID PANEL
CHOLESTEROL: 217 mg/dL — AB (ref 0–200)
HDL: 48 mg/dL (ref >39)
LDL Cholesterol: 150 mg/dL — ABNORMAL HIGH (ref 0–99)
TRIGLYCERIDES: 97 mg/dL (ref <150)
Total CHOL/HDL Ratio: 4.5 Ratio
VLDL: 19 mg/dL (ref 0–40)

## 2013-11-20 LAB — POCT CBC
GRANULOCYTE PERCENT: 73.8 % (ref 37–80)
HCT, POC: 46.5 % (ref 37.7–47.9)
HEMOGLOBIN: 14.6 g/dL (ref 12.2–16.2)
Lymph, poc: 1.3 (ref 0.6–3.4)
MCH, POC: 32.8 pg — AB (ref 27–31.2)
MCHC: 31.4 g/dL — AB (ref 31.8–35.4)
MCV: 104.5 fL — AB (ref 80–97)
MID (cbc): 0.5 (ref 0–0.9)
MPV: 9.7 fL (ref 0–99.8)
POC GRANULOCYTE: 4.9 (ref 2–6.9)
POC LYMPH %: 18.9 % (ref 10–50)
POC MID %: 7.3 %M (ref 0–12)
Platelet Count, POC: 241 10*3/uL (ref 142–424)
RBC: 4.45 M/uL (ref 4.04–5.48)
RDW, POC: 14.3 %
WBC: 6.7 10*3/uL (ref 4.6–10.2)

## 2013-11-20 LAB — HEPATITIS C ANTIBODY: HCV Ab: NEGATIVE

## 2013-11-20 LAB — TSH: TSH: 0.589 u[IU]/mL (ref 0.350–4.500)

## 2013-11-20 MED ORDER — PAROXETINE HCL 30 MG PO TABS: 30.0000 mg | ORAL_TABLET | Freq: Every day | ORAL | Status: DC

## 2013-11-20 MED ORDER — SIMVASTATIN 20 MG PO TABS: ORAL_TABLET | ORAL | Status: DC

## 2013-11-20 MED ORDER — ALPRAZOLAM 0.25 MG PO TABS: ORAL_TABLET | ORAL | Status: DC

## 2013-11-20 MED ORDER — LISINOPRIL-HYDROCHLOROTHIAZIDE 20-25 MG PO TABS: 1.0000 | ORAL_TABLET | Freq: Every day | ORAL | Status: DC

## 2013-11-20 NOTE — Patient Instructions (Addendum)
I will contact you with your lab results as soon as they are available.   If you have not heard from me in 2 weeks, please contact me.  The fastest way to get your results is to register for My Chart (see the instructions on the last page of this printout).  I have INCREASED the dose of the paroxetine (Paxil) to see if improving your anxiety and stress can help your memory.  Use the alprazolam (Xanax) twice daily AS NEEDED. I have also referred you to a neurologist to help evaluate your memory loss further.  Keeping You Healthy  Get These Tests  Blood Pressure- Have your blood pressure checked by your healthcare Yajaira Doffing at least once a year.  Normal blood pressure is 120/80.  Weight- Have your body mass index (BMI) calculated to screen for obesity.  BMI is a measure of body fat based on height and weight.  You can calculate your own BMI at https://www.west-[email or web address]/  Cholesterol- Have your cholesterol checked every year.  Diabetes- Have your blood sugar checked every year if you have high blood pressure, high cholesterol, a family history of diabetes or if you are overweight.  Pap Smear- Have a pap smear every 1 to 3 years if you have been sexually active.  If you are older than 65 and recent pap smears have been normal you may not need additional pap smears.  In addition, if you have had a hysterectomy  For benign disease additional pap smears are not necessary.  Mammogram-Yearly mammograms are essential for early detection of breast cancer  Screening for Colon Cancer- Colonoscopy starting at age 350. Screening may begin sooner depending on your family history and other health conditions.  Follow up colonoscopy as directed by your Gastroenterologist.  Screening for Osteoporosis- Screening begins at age 59 with bone density scanning, sooner if you are at higher risk for developing Osteoporosis.  Get these medicines  Calcium with Vitamin D- Your body requires 1200-1500 mg of Calcium a day  and [phone] IU of Vitamin D a day.  You can only absorb 500 mg of Calcium at a time therefore Calcium must be taken in 2 or 3 separate doses throughout the day.  Hormones- Hormone therapy has been associated with increased risk for certain cancers and heart disease.  Talk to your healthcare Shalunda Lindh about if you need relief from menopausal symptoms.  Aspirin- Ask your healthcare Tanai Bouler about taking Aspirin to prevent Heart Disease and Stroke.  Get these Immuniztions  Flu shot- Every fall  Pneumonia shot- Once after the age of 59; if you are younger ask your healthcare Shateka Petrea if you need a pneumonia shot.  Tetanus- Every ten years.  Zostavax- Once after the age of 10460 to prevent shingles.  Take these steps  Don't smoke- Your healthcare Arel Tippen can help you quit. For tips on how to quit, ask your healthcare Wake Conlee or go to www.smokefree.gov or call 1-800 QUIT-NOW.  Be physically active- Exercise 5 days a week for a minimum of 30 minutes.  If you are not already physically active, start slow and gradually work up to 30 minutes of moderate physical activity.  Try walking, dancing, bike riding, swimming, etc.  Eat a healthy diet- Eat a variety of healthy foods such as fruits, vegetables, whole grains, low fat milk, low fat cheeses, yogurt, lean meats, chicken, fish, eggs, dried beans, tofu, etc.  For more information go to www.thenutritionsource.org  Dental visit- Brush and floss teeth twice daily; visit your dentist twice a  year.  Eye exam- Visit your Optometrist or Ophthalmologist yearly.  Drink alcohol in moderation- Limit alcohol intake to one drink or less a day.  Never drink and drive.  Depression- Your emotional health is as important as your physical health.  If you're feeling down or losing interest in things you normally enjoy, please talk to your healthcare Nailah Luepke.  Seat Belts- can save your life; always wear one  Smoke/Carbon Monoxide detectors- These detectors need  to be installed on the appropriate level of your home.  Replace batteries at least once a year.  Violence- If anyone is threatening or hurting you, please tell your healthcare Victoriya Pol.  Living Will/ Health care power of attorney- Discuss with your healthcare Fields Oros and family.

## 2013-11-20 NOTE — Progress Notes (Signed)
Subjective:    Patient ID: Lindsay Collier, female    DOB: 1955-10-24, 58 y.o.   MRN: [id number]   PCP: Deniyah Dillavou, PA-C  Chief Complaint  Patient presents with  . Annual Exam    wants to discuss medications      Active Ambulatory Problems    Diagnosis Date Noted  . HTN (hypertension) 09/30/2011  . Hypercholesterolemia 09/30/2011  . Anxiety and depression 09/22/2012   Resolved Ambulatory Problems    Diagnosis Date Noted  . No Resolved Ambulatory Problems   Past Medical History  Diagnosis Date  . Hypertension   . Hyperlipidemia   . Anxiety   . Smoker   . Anemia     Past Surgical History  Procedure Laterality Date  . Cesarean section    . Abdominal hysterectomy      total  . Tonsillectomy and adenoidectomy    . Colonoscopy      2012    Allergies  Allergen Reactions  . Codeine     Excessive sweating    Prior to Admission medications   Medication Sig Start Date End Date Taking? Authorizing Provider  ALPRAZolam (XANAX) 0.25 MG tablet TAKE 1 TABLET BY MOUTH EVERY DAY AS NEEDED FOR ANXIETY 09/21/13  Yes Fuller Makin S Undrea Shipes, PA-C  cetirizine (ZYRTEC) 10 MG tablet Take 10 mg by mouth daily.   Yes Historical Provider, MD  ferrous sulfate 325 (65 FE) MG tablet Take 325 mg by mouth daily with breakfast.   Yes Historical Provider, MD  fish oil-omega-3 fatty acids 1000 MG capsule Take 2 g by mouth daily.   Yes Historical Provider, MD  lisinopril-hydrochlorothiazide (PRINZIDE,ZESTORETIC) 20-25 MG per tablet Take 1 tablet by mouth daily. PATIENT NEEDS OFFICE VISIT FOR ADDITIONAL REFILLS   Yes Heather M Marte, PA-C  OVER THE COUNTER MEDICATION Magnesium 250mg    Yes Historical Provider, MD  OVER THE COUNTER MEDICATION Vit D-3 1000iu   Yes Historical Provider, MD  OVER THE COUNTER MEDICATION Aloe Vera juice once daily   Yes Historical Provider, MD  PARoxetine (PAXIL) 20 MG tablet Take 1 tablet (20 mg total) by mouth daily. PATIENT NEEDS OFFICE VISIT FOR ADDITIONAL REFILLS    Yes Heather M Marte, PA-C  simvastatin (ZOCOR) 20 MG tablet TAKE 1 TABLET (20 MG TOTAL) BY MOUTH EVERY EVENING.   Yes Fernande Bras, PA-C    History   Social History  . Marital Status: Married    Spouse Name: [name]    Number of Children: 1  . Years of Education: 12+   Occupational History  . 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 DIETARY AIDE    Social History Main Topics  . Smoking status: Current Every Day Smoker    Types: Cigarettes  . Smokeless tobacco: Never Used     Comment: cutting back  . Alcohol Use: No  . Drug Use: No  . Sexual Activity: Yes    Partners: Male    Birth Control/ Protection: Surgical   Other Topics Concern  . None   Social History Narrative   Lives with her husband and their son.    family history includes Asthma in her son; Cancer in her father; Heart disease in her father; Hypertension in her father and mother. indicated that her mother is alive. She indicated that her father is deceased. She indicated that her sister is alive. She indicated that three of her four brothers are alive. She indicated that her maternal grandmother is deceased. She indicated  that her maternal grandfather is deceased. She indicated that her paternal grandmother is deceased. She indicated that her paternal grandfather is deceased. She indicated that her son is alive.   HPI  Presents with her husband of 31 years for annual exam. Mammogram needs updating (last 10/04/2012). Colonoscopy in 2012. TAH for non-cancerous reason. Pap 09/2011 was normal.  Her husband is concerned about her memory.  Initially he has a difficult time giving me examples of his concerns.  She seems forgetful, distracted.  She sometimes cannot follow simple instructions and seems confused.  She hasn't gotten lost or misplaced her keys, she remembers names, etc.  She doesn't need help with ADLs. She reveals that sometimes she has trouble at work with these same issues.  Her husband thinks this has  been going on for at least a year.  Review of Systems As above, otherwise remarkable only for "work stress." She has a lot of anxiety, but isn't depressed.    Objective:   Physical Exam  Vitals reviewed. Constitutional: She is oriented to person, place, and time. Vital signs are normal. She appears well-developed and well-nourished. She is active and cooperative. No distress.  HENT:  Head: Normocephalic and atraumatic.  Right Ear: Hearing, tympanic membrane, external ear and ear canal normal. No foreign bodies.  Left Ear: Hearing, tympanic membrane, external ear and ear canal normal. No foreign bodies.  Nose: Nose normal.  Mouth/Throat: Uvula is midline, oropharynx is clear and moist and mucous membranes are normal. No oral lesions. Normal dentition. No dental abscesses or uvula swelling. No oropharyngeal exudate.  Eyes: Conjunctivae, EOM and lids are normal. Pupils are equal, round, and reactive to light. Right eye exhibits no discharge. Left eye exhibits no discharge. No scleral icterus.  Fundoscopic exam:      The right eye shows no arteriolar narrowing, no AV nicking, no exudate, no hemorrhage and no papilledema.       The left eye shows no arteriolar narrowing, no AV nicking, no exudate, no hemorrhage and no papilledema.  Neck: Trachea normal, normal range of motion and full passive range of motion without pain. Neck supple. No spinous process tenderness and no muscular tenderness present. No mass and no thyromegaly present.  Cardiovascular: Normal rate, regular rhythm, normal heart sounds, intact distal pulses and normal pulses.   Pulmonary/Chest: Effort normal and breath sounds normal. She exhibits no tenderness and no retraction. Right breast exhibits no inverted nipple, no mass, no nipple discharge, no skin change and no tenderness. Left breast exhibits no inverted nipple, no mass, no nipple discharge, no skin change and no tenderness. Breasts are symmetrical.  Abdominal: Soft. Normal  appearance and bowel sounds are normal. She exhibits no distension and no mass. There is no hepatosplenomegaly. There is no tenderness. There is no rigidity, no rebound, no guarding, no CVA tenderness, no tenderness at McBurney's point and negative Murphy's sign. No hernia.  Genitourinary: No breast swelling, tenderness, discharge or bleeding. Pelvic exam was performed with patient supine.  Musculoskeletal: She exhibits no edema and no tenderness.       Cervical back: Normal.       Thoracic back: Normal.       Lumbar back: Normal.  Lymphadenopathy:       Head (right side): No tonsillar, no preauricular, no posterior auricular and no occipital adenopathy present.       Head (left side): No tonsillar, no preauricular, no posterior auricular and no occipital adenopathy present.    She has no cervical adenopathy.  She has no axillary adenopathy.       Right: No inguinal and no supraclavicular adenopathy present.       Left: No inguinal and no supraclavicular adenopathy present.  Neurological: She is alert and oriented to person, place, and time. She has normal strength and normal reflexes. She is not disoriented. No cranial nerve deficit or sensory deficit. She exhibits normal muscle tone. Coordination and gait normal.  Reflex Scores:      Bicep reflexes are 2+ on the right side and 2+ on the left side.      Patellar reflexes are 2+ on the right side and 2+ on the left side.      Achilles reflexes are 2+ on the right side and 2+ on the left side. When I asked her to remove her glasses and look at the wall (for the funduscopic exam), she removed her glasses and stood up, approaching the wall.  Skin: Skin is warm, dry and intact. No rash noted. She is not diaphoretic. No cyanosis or erythema. Nails show no clubbing.  Psychiatric: She has a normal mood and affect. Her speech is normal and behavior is normal. Judgment and thought content normal.      Mini-Mental Status Exam: ORIENTATION: 1. What  is the Year? 1/1 What is the Season? 0/1 What is the Date? 0/1 What is the Day? 0/1 What is the Month? 1/1  2. What State are we in? 1/1 What [place] are we in? 1/1 What City are we in? 0/1 What Office are we in? 1/1 What floor are we on? 1/1  REGISTRATION 3. Name 3 objects, and ask the patient to repeat them. 3/3  ATTENTION AND CALCULATION 4. Serial Sevens (100, 93, 86, 79, 72), (or WORLD backwards) 1/5 She got "D" only.  RECALL 5. What were the 3 objects I asked you to remember? 0/3  LANGUAGE 6. Pencil & Watch 2/2 7. "No IFs, ANDs or BUTs." 1/1 8. "Take this paper in your RIGHT hand, fold it in half, and place it on the floor." 3/3 9. "CLOSE YOUR EYES." 1/1 10. Write a sentence. 1/1 11. Copy the design. 0/1  Total score: 18/30. 20-24 suggests mild impairment 16-19 suggests moderate impairment 15 or less suggests severe deficit   Results for orders placed in visit on 11/20/13  POCT CBC      Result Value Ref Range   WBC 6.7  4.6 - 10.2 K/uL   Lymph, poc 1.3  0.6 - 3.4   POC LYMPH PERCENT 18.9  10 - 50 %L   MID (cbc) 0.5  0 - 0.9   POC MID % 7.3  0 - 12 %M   POC Granulocyte 4.9  2 - 6.9   Granulocyte percent 73.8  37 - 80 %G   RBC 4.45  4.04 - 5.48 M/uL   Hemoglobin 14.6  12.2 - 16.2 g/dL   HCT, POC 16.146.5  09.637.7 - 47.9 %   MCV 104.5 (*) 80 - 97 fL   MCH, POC 32.8 (*) 27 - 31.2 pg   MCHC 31.4 (*) 31.8 - 35.4 g/dL   RDW, POC 04.514.3     Platelet Count, POC 241  142 - 424 K/uL   MPV 9.7  0 - 99.8 fL       Assessment & Plan:  1. Annual physical exam Age appropriate anticipatory guidance provided.  2. Need for Tdap vaccination - Tdap vaccine greater than or equal to 7yo IM  3. Need for hepatitis C  screening test - Hepatitis C antibody  4. Anxiety and depression Increase paroxetine to 30 mg, as anxiety can cause forgetfulness, but I believe that she has some early underlying dementia.  I'll see her back in 1 month. - PARoxetine (PAXIL) 30 MG tablet; Take 1  tablet (30 mg total) by mouth daily.  Dispense: 90 tablet; Refill: 3 - ALPRAZolam (XANAX) 0.25 MG tablet; TAKE 1 TABLET BY MOUTH EVERY DAY AS NEEDED FOR ANXIETY  Dispense: 60 tablet; Refill: 0  5. Hypercholesterolemia Continue statin for now, though consider d/c to see if it makes a difference in her memory. - Lipid panel - simvastatin (ZOCOR) 20 MG tablet; TAKE 1 TABLET (20 MG TOTAL) BY MOUTH EVERY EVENING.  Dispense: 90 tablet; Refill: 3  6. Essential hypertension Well controlled.  Continue current treatment. - POCT CBC - Comprehensive metabolic panel - TSH - lisinopril-hydrochlorothiazide (PRINZIDE,ZESTORETIC) 20-25 MG per tablet; Take 1 tablet by mouth daily.  Dispense: 90 tablet; Refill: 3  7. Memory impairment Anticipatory guidance.   - Ambulatory referral to Neurology   Fernande Brashelle S. Kalika Smay, PA-C Physician Assistant-Certified [facility]

## 2013-11-21 ENCOUNTER — Encounter: Payer: Self-pay | Admitting: Physician Assistant

## 2013-11-21 MED ORDER — EZETIMIBE 10 MG PO TABS: 10.0000 mg | ORAL_TABLET | Freq: Every day | ORAL | Status: DC

## 2013-11-21 NOTE — Addendum Note (Signed)
Addended by: Fernande BrasJEFFERY, Gabrille Kilbride S on: 11/21/2013 02:29 PM   Modules accepted: Orders

## 2013-11-22 NOTE — Progress Notes (Signed)
Appointment cancelled

## 2013-12-04 ENCOUNTER — Ambulatory Visit: Payer: PRIVATE HEALTH INSURANCE | Admitting: Neurology

## 2013-12-04 ENCOUNTER — Telehealth: Payer: Self-pay | Admitting: Neurology

## 2013-12-04 NOTE — Telephone Encounter (Signed)
Patient no showed for an appointment today, 12/04/2013 at 8:30 AM.

## 2013-12-07 ENCOUNTER — Ambulatory Visit: Payer: PRIVATE HEALTH INSURANCE | Admitting: Physician Assistant

## 2013-12-18 ENCOUNTER — Other Ambulatory Visit: Payer: Self-pay

## 2013-12-18 DIAGNOSIS — Z1231 Encounter for screening mammogram for malignant neoplasm of breast: Secondary | ICD-10-CM

## 2014-01-02 ENCOUNTER — Ambulatory Visit
Admission: RE | Admit: 2014-01-02 | Discharge: 2014-01-02 | Disposition: A | Discharge status: Home / Self Care | Payer: PRIVATE HEALTH INSURANCE | Source: Ambulatory Visit

## 2014-01-02 DIAGNOSIS — Z1231 Encounter for screening mammogram for malignant neoplasm of breast: Secondary | ICD-10-CM

## 2014-01-14 ENCOUNTER — Other Ambulatory Visit: Payer: Self-pay | Admitting: Physician Assistant

## 2014-01-15 ENCOUNTER — Ambulatory Visit (INDEPENDENT_AMBULATORY_CARE_PROVIDER_SITE_OTHER): Payer: PRIVATE HEALTH INSURANCE | Admitting: Neurology

## 2014-01-15 ENCOUNTER — Encounter: Payer: Self-pay | Admitting: Neurology

## 2014-01-15 VITALS — BP 116/71 | HR 85 | Temp 98.0°F | Ht 64.0 in | Wt 113.0 lb

## 2014-01-15 DIAGNOSIS — F3289 Other specified depressive episodes: Secondary | ICD-10-CM

## 2014-01-15 DIAGNOSIS — F329 Major depressive disorder, single episode, unspecified: Secondary | ICD-10-CM

## 2014-01-15 DIAGNOSIS — F32A Depression, unspecified: Secondary | ICD-10-CM

## 2014-01-15 DIAGNOSIS — F17200 Nicotine dependence, unspecified, uncomplicated: Secondary | ICD-10-CM

## 2014-01-15 DIAGNOSIS — R413 Other amnesia: Secondary | ICD-10-CM

## 2014-01-15 DIAGNOSIS — F411 Generalized anxiety disorder: Secondary | ICD-10-CM

## 2014-01-15 NOTE — Patient Instructions (Signed)
We will investigate your memory loss further: we will do blood work, a brain scan and more detailed memory.  Please stop smoking! Drink more water and reduce your soda intake!

## 2014-01-15 NOTE — Progress Notes (Signed)
Subjective:    Patient ID: Lindsay Collier is a 58 y.o. female.  HPI    Huston Foley, MD, PhD [facility] [address] [address] [place], [place] [address]  Dear Lindsay Collier,   I saw your patient, Lindsay Collier, upon your kind request in my neurologic clinic today for initial consultation of her memory loss. The patient is accompanied by her husband today. Of note, the patient no showed for an appointment on 12/04/2013 at 8:30 AM.  As you know, [name] is a 58 year old right-handed woman with an underlying medical history of hyperlipidemia, hypertension, anxiety, depression, and smoking, who, per husband has had problems with her memory for the past years, including forgetfulness, easy distraction, difficulty following simple instructions and sometimes confusion. She reports forgetfulness and has had trouble doing her work. 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 She cannot tell, how long she has had a problem. Her husbands feels that memory loss started gradually, maybe 2-3 years ago. She drives, and has not gotten lost, no issues dressing, cooking.  Symptom onset was gradual and are now gradually worsening. She primarily has been having difficulty with short-term memory such as forgetfulness, misplacing things, asking the same question again and forgetting dates and events. Familiar faces are easily recognized. She had a CTH wo contrast on 10/22/10 which was reported as normal, but MRA head showed mild cerebellar atrophy.  She reports no recurrent headaches. There is family history of memory loss, perhaps, in her younger brother and maybe her parents, but she is not sure.   There is no report of Auditory Hallucinations and Visual Hallucinations and there are no delusions, such as paranoia.  She has not been on any dementia medications. She had some residual symptoms of depression and anxiety and had an increase in her Paxil to 30 mg. She denies suicidal ideations or  homicidal ideations.  The patient denies prior TIA or stroke symptoms, such as sudden onset of one sided weakness, numbness, tingling, slurring of speech or droopy face, hearing loss, tinnitus, diplopia or visual field cut or monocular loss of vision, and denies recurrent headaches.  Of note, the patient reports snoring, and there is no report of witnessed apneas or choking sensations while asleep. She drinks more than 3 Pepsis per day. She smokes about 1/2 ppd.   Her Past Medical History Is Significant For: Past Medical History  Diagnosis Date  . Hypertension   . Hyperlipidemia   . Anxiety   . Smoker   . Anemia     Her Past Surgical History Is Significant For: Past Surgical History  Procedure Laterality Date  . Cesarean section    . Abdominal hysterectomy      total  . Tonsillectomy and adenoidectomy    . Colonoscopy      2012    Her Family History Is Significant For: Family History  Problem Relation Age of Onset  . Hypertension Father   . Heart disease Father   . Cancer Father     Prostate  . Hypertension Mother   . Asthma Son   . Diabetes Brother     Her Social History Is Significant For: History   Social History  . Marital Status: Married    Spouse Name: Lindsay Collier    Number of Children: 1  . Years of Education: 12+   Occupational History  . 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 Smoking status: Current Every Day Smoker    Types: Cigarettes  . Smokeless tobacco: Never Used     Comment: cutting back,currently smoking 1/2 pk daily  . Alcohol Use: No  . Drug Use: No  . Sexual Activity: Yes    Partners: Male    Birth Control/ Protection: Surgical   Other Topics Concern  . None   Social History Narrative   Lives with her husband and their son. Patient is right handed    Her Allergies Are:  Allergies  Allergen Reactions  . Codeine     Excessive sweating  :   Her Current Medications Are:  Outpatient  Encounter Prescriptions as of 01/15/2014  Medication Sig  . ALPRAZolam (XANAX) 0.25 MG tablet TAKE 1 TABLET BY MOUTH EVERY DAY AS NEEDED FOR ANXIETY  . cetirizine (ZYRTEC) 10 MG tablet Take 10 mg by mouth daily.  Marland Kitchen ezetimibe (ZETIA) 10 MG tablet Take 1 tablet (10 mg total) by mouth daily.  . ferrous sulfate 325 (65 FE) MG tablet Take 325 mg by mouth daily with breakfast.  . fish oil-omega-3 fatty acids 1000 MG capsule Take 2 g by mouth daily.  Marland Kitchen lisinopril-hydrochlorothiazide (PRINZIDE,ZESTORETIC) 20-25 MG per tablet Take 1 tablet by mouth daily.  Marland Kitchen OVER THE COUNTER MEDICATION Magnesium 250mg   . OVER THE COUNTER MEDICATION Vit D-3 1000iu  . OVER THE COUNTER MEDICATION Aloe Vera juice once daily  . PARoxetine (PAXIL) 30 MG tablet Take 1 tablet (30 mg total) by mouth daily.  . simvastatin (ZOCOR) 20 MG tablet TAKE 1 TABLET (20 MG TOTAL) BY MOUTH EVERY EVENING.  :  Review of Systems:  Out of a complete 14 point review of systems, all are reviewed and negative with the exception of these symptoms as listed below:  Review of Systems  Allergic/Immunologic: Positive for environmental allergies.       [place]  Neurological:       Memory loss,confusion    Objective:  Neurologic Exam  Physical Exam Physical Examination:   Filed Vitals:   01/15/14 0820  BP: 116/71  Pulse: 85  Temp: 98 F (36.7 C)    General Examination: The patient is a very pleasant 58 y.o. female in no acute distress. She is calm and cooperative with the exam but is quite anxious appearing at times. She denies Auditory Hallucinations and Visual Hallucinations. She is adequately groomed and situated in a chair.   HEENT: Normocephalic, atraumatic, pupils are equal, round and reactive to light and accommodation. Funduscopic exam is normal with sharp disc margins noted. Extraocular tracking shows no saccadic breakdown without nystagmus noted. Hearing is intact. Tympanic membranes are obscured bilaterally with cerumen. Face  is symmetric with no facial masking and normal facial sensation. There is no lip, neck or jaw tremor. Neck is not rigid with intact passive ROM. There are no carotid bruits on auscultation. Oropharynx exam reveals moderate mouth dryness. Mild Lestine Mount crowding is noted. Mallampati is class II. Tongue protrudes centrally and palate elevates symmetrically.    Chest: is clear to auscultation without wheezing, rhonchi or crackles noted.  Heart: sounds are regular and normal without murmurs, rubs or gallops noted.   Abdomen: is soft, non-tender and non-distended with normal bowel sounds appreciated on auscultation.  Extremities: There is no pitting edema in the distal lower extremities bilaterally. Pedal pulses are intact.   Skin: is warm and dry with no trophic changes noted. Age-related changes are noted on the skin.   Musculoskeletal: exam reveals no obvious  joint deformities, tenderness or joint swelling or erythema.   Neurologically:  Mental status: The patient is awake and alert, paying good  attention. She is able to partially provide the history. Her husband provides details. She is oriented to: person, place, time/date, situation, day of week, month of year and year. Her memory, attention, language and knowledge are impaired. There is no aphasia, agnosia, apraxia or anomia. There is a mild degree of bradyphrenia. Speech is not hypophonic with no dysarthria noted. Mood is congruent and affect is blunted.  Her MMSE (Mini-Mental state exam) score is 18/30.  CDT (Clock Drawing Test) score is 2/4.  AFT (Animal Fluency Test) score is 11.  Cranial nerves are as described above under HEENT exam. In addition, shoulder shrug is normal with equal shoulder height noted.  Motor exam: Normal bulk, and strength for age is noted. Tone is not rigid with absence of cogwheeling in the extremities. There is overall no bradykinesia. There is no drift or rebound. There is no resting tremor. There is a mild b/l UE  postural and action tremor.  Romberg is negative. Reflexes are 3+ in the upper extremities and 3+ in the lower extremities. Toes are downgoing bilaterally. Fine motor skills:    Cerebellar testing shows no dysmetria or intention tremor on finger to nose testing. Heel to shin is unremarkable. There is no truncal or gait ataxia.   Sensory exam is intact to light touch, pinprick, vibration, temperature sense in the upper and lower extremities.   Gait, station and balance: She stands up from the seated position with no difficulty and needs no help. No veering to one side is noted. No leaning to one side. Posture is not stooped. Stance is narrow-based. She turns en block. Tandem walk is slightly difficult for her. Balance is not significantly  impaired.   Assessment and Plan:  In summary, Eddie Candleonia R [place] is a 58 y.o.-year old female with an underlying medical history of hyperlipidemia, hypertension, anxiety, depression, and smoking, who has had memory loss for the past few years, without evidence of behavioral changes. On exam, she has an MMSE of 18, and difficulty with visuospatial skills, including copying a figure and clock drawing. She likely has memory loss, beyond MCI, but she was quite nervous from time to time during this exam. She has had a recent increase in her Paxil dose. I would like to do more w/u in the form of blood work, MRI brain wo contrast and neuropsych testing. I had a long chat with the patient and her husband about my findings and the diagnosis of memory loss and dementia, its prognosis and treatment options. We talked about medical treatments and non-pharmacological approaches. We talked about smoking cessation and about maintaining a healthy lifestyle in general. I asked her to drink more water, reduce her soda intake and urged her to stop smoking. We did talk about potentially starting her on memory medication but I would like to see more test results first. As far as medications are  concerned, I recommended the following at this time: no change.  I answered all their questions today and the patient and her husband were in agreement with the above outlined plan. I would like to see the patient back in 3 months, sooner if the need arises and encouraged them to call with any interim questions, concerns, problems, updates or test results. We will be calling him with test results as they come back.   Thank you very much for allowing me to  participate in the care of this nice patient. If I can be of any further assistance to you please do not hesitate to call me at [phone].  Sincerely,   Star Age, MD, PhD

## 2014-01-16 LAB — ANA W/REFLEX: ANA: NEGATIVE

## 2014-01-16 LAB — B12 AND FOLATE PANEL
FOLATE: 7.7 ng/mL (ref >3.0)
Vitamin B-12: 1600 pg/mL — ABNORMAL HIGH (ref 211–946)

## 2014-01-16 LAB — TSH: TSH: 0.395 u[IU]/mL — ABNORMAL LOW (ref 0.450–4.500)

## 2014-01-16 LAB — RPR: RPR: NONREACTIVE

## 2014-01-16 LAB — HGB A1C W/O EAG: Hgb A1c MFr Bld: 5.1 % (ref 4.8–5.6)

## 2014-01-16 NOTE — Telephone Encounter (Signed)
Faxed

## 2014-01-18 NOTE — Progress Notes (Signed)
Quick Note:  Please call patient: Vitamin B12 was elevated, likely indicating that she is taking a supplement for this. TSH which is a thyroid screening test was borderline low. I would recommend a full thyroid function test through her primary care physician or endocrinologist if she has one. She can discuss this with her family doctor. Diabetes screening test and inflammatory markers were negative. Huston FoleySaima Linh Johannes, MD, PhD [facility]  ______

## 2014-01-19 ENCOUNTER — Telehealth: Payer: Self-pay | Admitting: Neurology

## 2014-01-19 ENCOUNTER — Telehealth: Payer: Self-pay | Admitting: *Deleted

## 2014-01-19 NOTE — Telephone Encounter (Signed)
Gave detailed lab results, patient understood that she should have full thyroid panel checked by PCP.

## 2014-01-19 NOTE — Telephone Encounter (Signed)
Patient's husband Lindsay Collier calling to state that patient said she didn't understand the lab results so husband is trying to get them in order to explain it to the patient. Please return call to patient's husband and advise.

## 2014-01-24 NOTE — Telephone Encounter (Signed)
Called and spoke with patient's husband and shared the lab results per Dr Frances Furbish, he verbalized understanding

## 2014-02-06 ENCOUNTER — Telehealth: Payer: Self-pay

## 2014-02-06 NOTE — Telephone Encounter (Signed)
Spoke to pt husband- Neurologist ordered MRI brain. Pt needs to follow up with PCP about her thyroid function. Pt will be in on Monday 02/12/14 to see Chelle.

## 2014-02-06 NOTE — Telephone Encounter (Signed)
MR [place] WOULD LIKE TO SPEAK WITH CHELLE RE HIS WIFES MEMORY AND THYROID ISSUES   BEST PHONE [phone]

## 2014-02-06 NOTE — Telephone Encounter (Signed)
Lm for rtn call 

## 2014-02-07 ENCOUNTER — Ambulatory Visit (INDEPENDENT_AMBULATORY_CARE_PROVIDER_SITE_OTHER): Payer: PRIVATE HEALTH INSURANCE | Admitting: Physician Assistant

## 2014-02-07 VITALS — BP 116/62 | HR 82 | Temp 97.7°F | Resp 18 | Ht 63.0 in | Wt 109.0 lb

## 2014-02-07 DIAGNOSIS — R413 Other amnesia: Secondary | ICD-10-CM

## 2014-02-07 DIAGNOSIS — F17200 Nicotine dependence, unspecified, uncomplicated: Secondary | ICD-10-CM

## 2014-02-07 DIAGNOSIS — Z720 Tobacco use: Secondary | ICD-10-CM

## 2014-02-07 DIAGNOSIS — R946 Abnormal results of thyroid function studies: Secondary | ICD-10-CM

## 2014-02-07 DIAGNOSIS — F0390 Unspecified dementia without behavioral disturbance: Secondary | ICD-10-CM | POA: Insufficient documentation

## 2014-02-07 DIAGNOSIS — F32A Depression, unspecified: Secondary | ICD-10-CM

## 2014-02-07 DIAGNOSIS — I10 Essential (primary) hypertension: Secondary | ICD-10-CM

## 2014-02-07 DIAGNOSIS — F419 Anxiety disorder, unspecified: Secondary | ICD-10-CM

## 2014-02-07 DIAGNOSIS — E7800 Pure hypercholesterolemia, unspecified: Secondary | ICD-10-CM

## 2014-02-07 DIAGNOSIS — R7989 Other specified abnormal findings of blood chemistry: Secondary | ICD-10-CM

## 2014-02-07 DIAGNOSIS — F329 Major depressive disorder, single episode, unspecified: Secondary | ICD-10-CM

## 2014-02-07 DIAGNOSIS — F341 Dysthymic disorder: Secondary | ICD-10-CM

## 2014-02-07 LAB — POCT CBC
Granulocyte percent: 80.1 %G — AB (ref 37–80)
HEMATOCRIT: 42.7 % (ref 37.7–47.9)
HEMOGLOBIN: 13.8 g/dL (ref 12.2–16.2)
LYMPH, POC: 1 (ref 0.6–3.4)
MCH: 33.6 pg — AB (ref 27–31.2)
MCHC: 32.4 g/dL (ref 31.8–35.4)
MCV: 103.5 fL — AB (ref 80–97)
MID (cbc): 0.5 (ref 0–0.9)
MPV: 7.7 fL (ref 0–99.8)
POC Granulocyte: 6.2 (ref 2–6.9)
POC LYMPH PERCENT: 13 %L (ref 10–50)
POC MID %: 6.9 % (ref 0–12)
Platelet Count, POC: 239 10*3/uL (ref 142–424)
RBC: 4.12 M/uL (ref 4.04–5.48)
RDW, POC: 13.7 %
WBC: 7.8 10*3/uL (ref 4.6–10.2)

## 2014-02-07 LAB — T4, FREE: FREE T4: 0.9 ng/dL (ref 0.80–1.80)

## 2014-02-07 LAB — T3, FREE: T3, Free: 2.8 pg/mL (ref 2.3–4.2)

## 2014-02-07 LAB — TSH: TSH: 0.999 u[IU]/mL (ref 0.350–4.500)

## 2014-02-07 NOTE — Patient Instructions (Signed)
Call [facility] and ask for Renee, who schedules all the MRIs. Ask Dr. Frances Furbish about Lindsay Collier at your next visit.  While it doesn't contain anything harmful, there is no scientific evidence that it is helpful for memory.

## 2014-02-07 NOTE — Progress Notes (Signed)
Subjective:    Patient ID: Lindsay Collier, female    DOB: Apr 16, 1956, 58 y.o.   MRN: [id number]   PCP: Alekzander Cardell, PA-C  Chief Complaint  Patient presents with  . Follow-up    thyroid and b12, memory issues    Medications, allergies, past medical history, surgical history, family history, social history and problem list reviewed and updated.  Patient Active Problem List   Diagnosis Date Noted  . Anxiety and depression 09/22/2012  . HTN (hypertension) 09/30/2011  . Hypercholesterolemia 09/30/2011    Prior to Admission medications   Medication Sig Start Date End Date Taking? Authorizing Provider  ALPRAZolam Prudy Feeler) 0.25 MG tablet TAKE 1 TABLET EVERY DAY AS NEEDED FOR ANXIETY 01/15/14  Yes Prosper Paff S Sherah Lund, PA-C  cetirizine (ZYRTEC) 10 MG tablet Take 10 mg by mouth daily.   Yes Historical Provider, MD  ezetimibe (ZETIA) 10 MG tablet Take 1 tablet (10 mg total) by mouth daily. 11/21/13  Yes Aamir Mclinden S Audley Hinojos, PA-C  ferrous sulfate 325 (65 FE) MG tablet Take 325 mg by mouth daily with breakfast.   Yes Historical Provider, MD  fish oil-omega-3 fatty acids 1000 MG capsule Take 2 g by mouth daily.   Yes Historical Provider, MD  lisinopril-hydrochlorothiazide (PRINZIDE,ZESTORETIC) 20-25 MG per tablet Take 1 tablet by mouth daily. 11/20/13  Yes Betul Brisky S Hydee Fleece, PA-C  Multiple Vitamin (MULTIVITAMIN) tablet Take 1 tablet by mouth daily.   Yes Historical Provider, MD  OVER THE COUNTER MEDICATION Magnesium    Yes Historical Provider, MD  OVER THE COUNTER MEDICATION Vit D-3 1000iu   Yes Historical Provider, MD  OVER THE COUNTER MEDICATION Aloe Vera juice once daily   Yes Historical Provider, MD  PARoxetine (PAXIL) 30 MG tablet Take 1 tablet (30 mg total) by mouth daily. 11/20/13  Yes Lanetta Figuero S Isami Mehra, PA-C  simvastatin (ZOCOR) 20 MG tablet TAKE 1 TABLET (20 MG TOTAL) BY MOUTH EVERY EVENING. 11/20/13  Yes Tynlee Bayle S Leotis Shames, PA-C    HPI  This 58 y.o. female presents for evaluation of  thyroid function. Saw Dr. Frances Furbish 01/15/2014 regarding memory loss. They report that her memory loss seems to be stable since I saw them in June, but is very concerning to them.  While she thinks the increase in the Paxil dose has helped her mood, her husband believes that she is sometimes depressed because she recognizes her memory loss. To see another specialist in 05/07/2014 for additional testing, but they aren't sure who it is or what the testing is (possibly in-depth neuropsych?). To have an MRI, not yet scheduled. TSH was low (was low normal 10/2013), and she needs additional thyroid studies. Vitamin B12 was elevated, so her husband has stopped the B12 supplement.  Had an episode of sleep walking. Husband put her in the shower to try to wake her up. She came out of the bathroom and flopped on the bed, and briefly didn't respond to her husband calling her name. When she awoke, she complained of being really hot, and was flapping her arms about, like trying to fan herself. She frequently complains of being hot and sticky. Her husband, thinking she was awake, suggested that she cool off in the shower. She started to take a shower, had turned on the water, began to collapse, and he caught her. Her eyes were wide open and "glossy" but wouldn't respond.  Then all of a sudden, she" came to." Had a similar episode about 4 years ago, was evaluated by EMS and then in  the ED, and was found to be anemic (CBC was normal in 10/2013). History of sleep walking during childhood.  Her husband asks about a product, [place], as seen on TV for memory.  She continues to smoke, and isn't really interested in quitting.  Her husband has suggested that she not smoke first thing in the morning until she's definitely awake.  Review of Systems     Objective:   Physical Exam  Constitutional: She is oriented to person, place, and time. She appears well-developed and well-nourished. No distress.  BP 116/62  Pulse 82   Temp(Src) 97.7 F (36.5 C) (Oral)  Resp 18  Ht  (1.6 m)  Wt 109 lb (49.442 kg)  BMI 19.31 kg/m2  SpO2 98%  LMP 06/01/2006   Eyes: Conjunctivae are normal. Right eye exhibits no discharge. Left eye exhibits no discharge. No scleral icterus.  Neck: Neck supple. No thyromegaly present.  Cardiovascular: Normal rate, regular rhythm, normal heart sounds and intact distal pulses.   Pulmonary/Chest: Effort normal and breath sounds normal.  Lymphadenopathy:    She has no cervical adenopathy.  Neurological: She is alert and oriented to person, place, and time.  Skin: Skin is warm and dry.  Psychiatric: She has a normal mood and affect. Her speech is normal and behavior is normal.   Results for orders placed in visit on 02/07/14  POCT CBC      Result Value Ref Range   WBC 7.8  4.6 - 10.2 K/uL   Lymph, poc 1.0  0.6 - 3.4   POC LYMPH PERCENT 13.0  10 - 50 %L   MID (cbc) 0.5  0 - 0.9   POC MID % 6.9  0 - 12 %M   POC Granulocyte 6.2  2 - 6.9   Granulocyte percent 80.1 (*) 37 - 80 %G   RBC 4.12  4.04 - 5.48 M/uL   Hemoglobin 13.8  12.2 - 16.2 g/dL   HCT, POC 16.1  09.6 - 47.9 %   MCV 103.5 (*) 80 - 97 fL   MCH, POC 33.6 (*) 27 - 31.2 pg   MCHC 32.4  31.8 - 35.4 g/dL   RDW, POC 04.5     Platelet Count, POC 239  142 - 424 K/uL   MPV 7.7  0 - 99.8 fL          Assessment & Plan:  1. Memory loss Proceed with MRI brain and additional testing with neurology.  Follow-up with Dr. Frances Furbish is in January 2016. While there does not appear to be anything dangerous for her in the [place] product, there is also no evidence it is helpful.  Advise they discuss it's use with Dr. Frances Furbish at their next visit.  2. Anxiety and depression Stable.  Continue current treatment for now.  3. Essential hypertension Stable. Not anemic. - POCT CBC  4. Tobacco use Encouraged smoking cessation.  5. Low TSH level Await lab results. - TSH - T3, Free - T4, Free   Fernande Bras, PA-C Physician  Assistant-Certified [facility]

## 2014-02-12 ENCOUNTER — Other Ambulatory Visit: Payer: PRIVATE HEALTH INSURANCE

## 2014-02-25 DIAGNOSIS — R413 Other amnesia: Secondary | ICD-10-CM

## 2014-02-26 ENCOUNTER — Ambulatory Visit
Admission: RE | Admit: 2014-02-26 | Discharge: 2014-02-26 | Disposition: A | Discharge status: Home / Self Care | Payer: PRIVATE HEALTH INSURANCE | Source: Ambulatory Visit | Attending: Neurology | Admitting: Neurology

## 2014-02-26 DIAGNOSIS — F411 Generalized anxiety disorder: Secondary | ICD-10-CM

## 2014-02-26 DIAGNOSIS — R413 Other amnesia: Secondary | ICD-10-CM

## 2014-02-26 DIAGNOSIS — F329 Major depressive disorder, single episode, unspecified: Secondary | ICD-10-CM

## 2014-02-26 DIAGNOSIS — F17200 Nicotine dependence, unspecified, uncomplicated: Secondary | ICD-10-CM

## 2014-02-26 DIAGNOSIS — F32A Depression, unspecified: Secondary | ICD-10-CM

## 2014-03-12 ENCOUNTER — Telehealth: Payer: Self-pay | Admitting: *Deleted

## 2014-03-12 DIAGNOSIS — F0390 Unspecified dementia without behavioral disturbance: Secondary | ICD-10-CM

## 2014-03-12 MED ORDER — DONEPEZIL HCL 5 MG PO TABS: 5.0000 mg | ORAL_TABLET | Freq: Every day | ORAL | Status: DC

## 2014-03-12 NOTE — Telephone Encounter (Signed)
Please document findings of MRI patient requesting results.

## 2014-03-12 NOTE — Telephone Encounter (Signed)
I called and talked to the patient's husband regarding her recent brain MRI results. I explained the findings to him in particular the fact that her atrophy is age inappropriate and has advanced since May 2012. They have an appointment pending for a neuropsychological evaluation in December and a followup with me in January. I suggested we start her on Aricept 5 mg strength and I sent a prescription to her pharmacy. I talked to the patient's husband regarding how to take Aricept and possible side effects to look for. He demonstrated understanding and agreement.

## 2014-03-17 ENCOUNTER — Other Ambulatory Visit: Payer: Self-pay | Admitting: Physician Assistant

## 2014-03-19 NOTE — Telephone Encounter (Signed)
Script faxed to the pharmacy.

## 2014-05-07 DIAGNOSIS — R413 Other amnesia: Secondary | ICD-10-CM

## 2014-05-15 ENCOUNTER — Other Ambulatory Visit: Payer: Self-pay | Admitting: Physician Assistant

## 2014-05-16 NOTE — Telephone Encounter (Signed)
Faxed

## 2014-05-17 ENCOUNTER — Other Ambulatory Visit: Payer: Self-pay | Admitting: Physician Assistant

## 2014-06-26 ENCOUNTER — Ambulatory Visit: Payer: PRIVATE HEALTH INSURANCE | Admitting: Neurology

## 2014-07-16 ENCOUNTER — Other Ambulatory Visit: Payer: Self-pay | Admitting: Physician Assistant

## 2014-07-20 NOTE — Telephone Encounter (Signed)
There was no documentation that this had been faxed already, so I called it in also.

## 2014-07-27 ENCOUNTER — Other Ambulatory Visit: Payer: Self-pay | Admitting: Neurology

## 2014-08-13 ENCOUNTER — Encounter: Payer: Self-pay | Admitting: Neurology

## 2014-08-13 ENCOUNTER — Ambulatory Visit (INDEPENDENT_AMBULATORY_CARE_PROVIDER_SITE_OTHER): Payer: PRIVATE HEALTH INSURANCE | Admitting: Neurology

## 2014-08-13 VITALS — BP 114/79 | HR 71 | Temp 98.1°F | Resp 14 | Ht 63.0 in | Wt 119.0 lb

## 2014-08-13 DIAGNOSIS — F329 Major depressive disorder, single episode, unspecified: Secondary | ICD-10-CM | POA: Diagnosis not present

## 2014-08-13 DIAGNOSIS — F17200 Nicotine dependence, unspecified, uncomplicated: Secondary | ICD-10-CM

## 2014-08-13 DIAGNOSIS — F0390 Unspecified dementia without behavioral disturbance: Secondary | ICD-10-CM

## 2014-08-13 DIAGNOSIS — Z720 Tobacco use: Secondary | ICD-10-CM

## 2014-08-13 DIAGNOSIS — F32A Depression, unspecified: Secondary | ICD-10-CM

## 2014-08-13 MED ORDER — DONEPEZIL HCL 10 MG PO TABS
10.0000 mg | ORAL_TABLET | Freq: Every day | ORAL | Status: DC
Start: 2014-08-13 — End: 2015-01-14

## 2014-08-13 NOTE — Progress Notes (Signed)
Subjective:    Patient ID: Lindsay Collier is a 59 y.o. female.  HPI       Interim history:   [name] is a 59 year-old right-handed woman with an underlying medical history of hyperlipidemia, hypertension, anxiety, depression, and smoking, who presents for follow-up consultation of her memory loss. The patient is accompanied by her husband again today. I first met her on 01/15/2014 at the request of her primary care provider, at which time her husband reported a several year history of memory loss including forgetfulness, easy distractibility, difficulty focusing and following instructions and difficulty following through at work. He felt that her memory loss started about 2-3 years ago, with gradual progression. At the time of her first visit I suggested we proceed with an MRI brain and formal cognitive testing and we also did some blood work. Her TSH was borderline low at 0.395. RPR was negative, vitamin B12 was elevated at 1600. We called and talked to her husband about her blood test results and advised him to pursue repeat thyroid function testing through her primary care physician. Her MMSE at that time was 18, clock drawing was 2 out of 4, animal fluency was 11. She was advised to increase her water intake, reduce her soda intake, and stop smoking. I did not start her on any new medication at the time. She had a brain MRI without contrast on 02/25/2014: Abnormal MRI brain showing  mild changes of chronic microvascular ischemia and  age disproportion generalized cerebral atrophy which appeared to have progressed compared with previous scan from 2012. In addition, personally reviewed the images through the PACS system. I also talked to her husband on the phone on 03/12/2014 about her MRI results and suggested we go ahead and start her on Aricept 5 mg strength. I talked to him about potential side effects. She was seen for a diagnostic interview by Dr. Valentina Shaggy on 05/07/2014 and I reviewed the  report. Unfortunately, they did not pursue neuropsychological testing secondary to the cost factor as I understand.  Today, 08/13/2014: she is able to provide some history. Her husband provides probably more of her history. She has been able to tolerate the Aricept 5 mg strength. Repeat TSH was done in September when she saw her primary care provider and I reviewed the lab results for TFTs. They were unremarkable at the time. She has not yet quit smoking. She still drinks sodas but also some water in between. She still works. She still drives. She indicated that she got lost one time recently while driving but her husband indicates that this was because there was traffic and he had called her at work and asked her to take a different route.  She feels that her memory is stable. She had no side effects with the Aricept. In particular, asked her about GI related side effects, lightheadedness, blurry vision, balance problems. She indicates that her mood is stable.  Previously:  She drives, and has not gotten lost, no issues dressing, cooking.  Symptom onset was gradual and are now gradually worsening. She primarily has been having difficulty with short-term memory such as forgetfulness, misplacing things, asking the same question again and forgetting dates and events. Familiar faces are easily recognized. She had a CTH wo contrast on 10/22/10 which was reported as normal, but MRA head showed mild cerebellar atrophy.   She reports no recurrent headaches. There is family history of memory loss, perhaps, in her younger brother and maybe her parents, but she  is not sure.    There is no report of Auditory Hallucinations and Visual Hallucinations and there are no delusions, such as paranoia.   She has not been on any dementia medications. She had some residual symptoms of depression and anxiety and had an increase in her Paxil to 30 mg. She denies suicidal ideations or homicidal ideations.   The patient denies prior  TIA or stroke symptoms, such as sudden onset of one sided weakness, numbness, tingling, slurring of speech or droopy face, hearing loss, tinnitus, diplopia or visual field cut or monocular loss of vision, and denies recurrent headaches.  Of note, the patient reports snoring, and there is no report of witnessed apneas or choking sensations while asleep. She drinks more than 3 Pepsis per day. She smokes about 1/2 ppd.   Her Past Medical History Is Significant For: Past Medical History  Diagnosis Date  . Hypertension   . Hyperlipidemia   . Anxiety   . Smoker   . Anemia     Her Past Surgical History Is Significant For: Past Surgical History  Procedure Laterality Date  . Cesarean section    . Abdominal hysterectomy      total  . Tonsillectomy and adenoidectomy    . Colonoscopy      2012    Her Family History Is Significant For: Family History  Problem Relation Age of Onset  . Hypertension Father   . Heart disease Father   . Cancer Father     Prostate  . Hypertension Mother   . Asthma Son   . Diabetes Brother     Her Social History Is Significant For: History   Social History  . Marital Status: Married    Spouse Name: Coralyn Mark  . Number of Children: 1  . Years of Education: 12+   Occupational History  . Dietary Aid     [employer]  . DIETARY AIDE    Social History Main Topics  . Smoking status: Current Every Day Smoker    Types: Cigarettes  . Smokeless tobacco: Never Used     Comment: cutting back,currently smoking 1/2 pk daily  . Alcohol Use: No  . Drug Use: No  . Sexual Activity:    Partners: Male    Birth Control/ Protection: Surgical   Other Topics Concern  . None   Social History Narrative   Lives with her husband and their son. Patient is right handed    Her Allergies Are:  Allergies  Allergen Reactions  . Codeine     Excessive sweating  :   Her Current Medications Are:  Outpatient Encounter Prescriptions as of 08/13/2014  Medication  Sig  . ALPRAZolam (XANAX) 0.25 MG tablet TAKE 1 TABLET BY MOUTH EVERY DAY AS NEEDED FOR ANXIETY  . cetirizine (ZYRTEC) 10 MG tablet Take 10 mg by mouth daily.  Marland Kitchen donepezil (ARICEPT) 10 MG tablet Take 1 tablet (10 mg total) by mouth at bedtime.  Marland Kitchen ezetimibe (ZETIA) 10 MG tablet Take 1 tablet (10 mg total) by mouth daily.  . ferrous sulfate 325 (65 FE) MG tablet Take 325 mg by mouth daily with breakfast.  . fish oil-omega-3 fatty acids 1000 MG capsule Take 2 g by mouth daily.  Marland Kitchen lisinopril-hydrochlorothiazide (PRINZIDE,ZESTORETIC) 20-25 MG per tablet Take 1 tablet by mouth daily.  . Multiple Vitamin (MULTIVITAMIN) tablet Take 1 tablet by mouth daily.  Marland Kitchen OVER THE COUNTER MEDICATION Magnesium 265m  . OVER THE COUNTER MEDICATION Vit D-3 1000iu  . OVER THE  COUNTER MEDICATION Aloe Vera juice once daily  . PARoxetine (PAXIL) 30 MG tablet Take 1 tablet (30 mg total) by mouth daily.  . simvastatin (ZOCOR) 20 MG tablet TAKE 1 TABLET (20 MG TOTAL) BY MOUTH EVERY EVENING.  . [DISCONTINUED] donepezil (ARICEPT) 5 MG tablet TAKE 1 TABLET (5 MG TOTAL) BY MOUTH AT BEDTIME.  :  Review of Systems:  Out of a complete 14 point review of systems, all are reviewed and negative with the exception of these symptoms as listed below:   Review of Systems  Allergic/Immunologic: Positive for environmental allergies.    Objective:  Neurologic Exam  Physical Exam Physical Examination:   Filed Vitals:   08/13/14 1301  BP: 114/79  Pulse: 71  Temp: 98.1 F (36.7 C)  Resp: 14    General Examination: The patient is a very pleasant 59 yo female in no acute distress. She is calm and cooperative with the exam but is quite anxious appearing at times. She  looks at her husband for some of the answers at times. She is adequately groomed and situated in her chair.   HEENT: Normocephalic, atraumatic, pupils are equal, round and reactive to light and accommodation. Funduscopic exam is normal with sharp disc margins  noted. Extraocular tracking shows no saccadic breakdown without nystagmus noted. Hearing is intact. Tympanic membranes are obscured bilaterally with cerumen. Face is symmetric with no facial masking and normal facial sensation. There is no lip, neck or jaw tremor. Neck is not rigid with intact passive ROM. There are no carotid bruits on auscultation. Oropharynx exam reveals  mild to moderate mouth dryness. Mild airway crowding is noted. Mallampati is class II. Tongue protrudes centrally and palate elevates symmetrically.    Chest: is clear to auscultation without wheezing, rhonchi or crackles noted.  Heart: sounds are regular and normal without murmurs, rubs or gallops noted.   Abdomen: is soft, non-tender and non-distended with normal bowel sounds appreciated on auscultation.  Extremities: There is no pitting edema in the distal lower extremities bilaterally. Pedal pulses are intact.   Skin: is warm and dry with no trophic changes noted. Age-related changes are noted on the skin.   Musculoskeletal: exam reveals no obvious joint deformities, tenderness or joint swelling or erythema.   Neurologically:  Mental status: The patient is awake and alert, paying good  attention. She is able to partially provide the history. Her husband provides details. She is oriented to: person, place, time/date, situation, day of week, month of year and year. Her memory, attention, language and knowledge are impaired. There is no aphasia, agnosia, apraxia or anomia. There is a mild degree of bradyphrenia. Speech is not hypophonic with no dysarthria noted. Mood is congruent and affect is blunted.  On 01/16/15: Her MMSE (Mini-Mental state exam) score is 18/30.  CDT (Clock Drawing Test) score is 2/4.  AFT (Animal Fluency Test) score is 11.  On 08/13/2014: MMSE 20/30,  clock drawing is 1 out of 4 and animal fluency is 7.   Cranial nerves are as described above under HEENT exam. In addition, shoulder shrug is normal with  equal shoulder height noted.  Motor exam: Normal bulk, and strength for age is noted. Tone is not rigid with absence of cogwheeling in the extremities. There is overall no bradykinesia. There is no drift or rebound. There is no resting tremor. There is a mild b/l UE postural and action tremor.  Romberg is negative. Reflexes are 3+ in the upper extremities and 3+ in the lower extremities.  Toes are downgoing bilaterally. Fine motor skills: are unremarkable with finger to nose testing and hand movements.   Cerebellar testing shows no dysmetria or intention tremor on finger to nose testing. Heel to shin is unremarkable. There is no truncal or gait ataxia.   Sensory exam is intact to light touch, pinprick, vibration, temperature sense in the upper and lower extremities.   Gait, station and balance: She stands up from the seated position with no difficulty and needs no help. No veering to one side is noted. No leaning to one side. Posture is not stooped. Stance is narrow-based. She turns en block. Tandem walk is slightly difficult for her. Balance is not significantly impaired overall.   Assessment and Plan:  In summary, Richardine Peppers Collier is a 59 year old female with an underlying medical history of hyperlipidemia, hypertension, anxiety, depression, and smoking, who presents for follow-up consultation of her memory loss. her history, laboratory testing, workup thus far indicates dementia without behavioral problems. She is at risk for vascular dementia. We talked about her brain MRI. We talked about her recent blood test from August and September. She has been able to tolerate Aricept at 5 mg strength generic. I would like to increase this to 10 mg strength for a more appropriate maintenance dose. We talked about her driving quite a bit today. I'm not sure if she is completely safe to drive at this time. Her visual spatial skills have I think declined, in that her clock drawing is worse today and also her  processing speed is probably a little bit less today compared to last time gauging from the animal fluency score. Unfortunately, we were not able to pursue formal neuropsychological testing secondary to cost. At this juncture, I have asked her husband to observe her driving for example when she goes to work one day. He is encouraged to call me and report as to her ability to drive so we can have some form of measure. He has not driven with her in a while he indicates. I'm just a little bit worried about her driving. I've encouraged her to drink more water, and stop smoking. I suggested we increase the Aricept generic to 10 mg once daily. I would like to see her back in 4 months, sooner if the need arises. I answered all her questions today and the patient and her husband were in agreement.  I spent 25 minutes in total face-to-face time with the patient, more than 50% of which was spent in counseling and coordination of care, reviewing test results, reviewing medication and discussing or reviewing the diagnosis of dementia, its prognosis and treatment options as well as her test results and her driving.

## 2014-08-13 NOTE — Patient Instructions (Addendum)
We will increase your Aricept generic to 10 mg daily.  I will see you back in 4 months.  I would like for your husband to observe your driving and call us after to report/comment about your driving skills.  Please stop smoking.

## 2014-08-20 ENCOUNTER — Encounter: Payer: Self-pay | Admitting: Physician Assistant

## 2014-09-13 ENCOUNTER — Other Ambulatory Visit: Payer: Self-pay | Admitting: Physician Assistant

## 2014-09-13 NOTE — Telephone Encounter (Signed)
rx called in

## 2014-09-13 NOTE — Telephone Encounter (Signed)
Chelle, it looks like pt is due for f/up so I put notice on pended RF. I did notice that sig and # do not match in case you want to change one or the other.

## 2014-09-13 NOTE — Telephone Encounter (Signed)
Please advise patient/husband that she needs an OV with me.  Meds ordered this encounter  Medications  . ALPRAZolam (XANAX) 0.25 MG tablet    Sig: Take 1 tablet every day as needed for anxiety. PATIENT NEEDS OFFICE VISIT FOR ADDITIONAL REFILLS    Dispense:  30 tablet    Refill:  0    Not to exceed 5 additional fills before 01/14/2015

## 2014-09-14 NOTE — Telephone Encounter (Signed)
Notified husband (on HIPPA) that pt needs OV for more. He agreed to let pt know.

## 2014-10-03 ENCOUNTER — Ambulatory Visit (INDEPENDENT_AMBULATORY_CARE_PROVIDER_SITE_OTHER): Payer: PRIVATE HEALTH INSURANCE | Admitting: Physician Assistant

## 2014-10-03 VITALS — BP 124/84 | HR 79 | Temp 98.2°F | Resp 17 | Ht 64.0 in | Wt 126.0 lb

## 2014-10-03 DIAGNOSIS — Z720 Tobacco use: Secondary | ICD-10-CM | POA: Diagnosis not present

## 2014-10-03 DIAGNOSIS — I10 Essential (primary) hypertension: Secondary | ICD-10-CM

## 2014-10-03 DIAGNOSIS — Z114 Encounter for screening for human immunodeficiency virus [HIV]: Secondary | ICD-10-CM

## 2014-10-03 DIAGNOSIS — E7800 Pure hypercholesterolemia, unspecified: Secondary | ICD-10-CM

## 2014-10-03 DIAGNOSIS — F329 Major depressive disorder, single episode, unspecified: Secondary | ICD-10-CM

## 2014-10-03 DIAGNOSIS — F0390 Unspecified dementia without behavioral disturbance: Secondary | ICD-10-CM | POA: Diagnosis not present

## 2014-10-03 DIAGNOSIS — F419 Anxiety disorder, unspecified: Secondary | ICD-10-CM

## 2014-10-03 DIAGNOSIS — F32A Depression, unspecified: Secondary | ICD-10-CM

## 2014-10-03 DIAGNOSIS — F418 Other specified anxiety disorders: Secondary | ICD-10-CM | POA: Diagnosis not present

## 2014-10-03 LAB — CBC WITH DIFFERENTIAL/PLATELET
BASOS ABS: 0.1 10*3/uL (ref 0.0–0.1)
Basophils Relative: 1 % (ref 0–1)
EOS PCT: 2 % (ref 0–5)
Eosinophils Absolute: 0.2 10*3/uL (ref 0.0–0.7)
HCT: 39.3 % (ref 36.0–46.0)
HEMOGLOBIN: 13.1 g/dL (ref 12.0–15.0)
LYMPHS ABS: 1.2 10*3/uL (ref 0.7–4.0)
LYMPHS PCT: 15 % (ref 12–46)
MCH: 34.1 pg — AB (ref 26.0–34.0)
MCHC: 33.3 g/dL (ref 30.0–36.0)
MCV: 102.3 fL — ABNORMAL HIGH (ref 78.0–100.0)
MPV: 11.2 fL (ref 8.6–12.4)
Monocytes Absolute: 0.6 10*3/uL (ref 0.1–1.0)
Monocytes Relative: 8 % (ref 3–12)
NEUTROS ABS: 5.8 10*3/uL (ref 1.7–7.7)
NEUTROS PCT: 74 % (ref 43–77)
Platelets: 252 10*3/uL (ref 150–400)
RBC: 3.84 MIL/uL — ABNORMAL LOW (ref 3.87–5.11)
RDW: 12.8 % (ref 11.5–15.5)
WBC: 7.9 10*3/uL (ref 4.0–10.5)

## 2014-10-03 LAB — COMPREHENSIVE METABOLIC PANEL
ALK PHOS: 83 U/L (ref 39–117)
ALT: 19 U/L (ref 0–35)
AST: 29 U/L (ref 0–37)
Albumin: 4.3 g/dL (ref 3.5–5.2)
BILIRUBIN TOTAL: 0.3 mg/dL (ref 0.2–1.2)
BUN: 14 mg/dL (ref 6–23)
CO2: 23 meq/L (ref 19–32)
CREATININE: 0.78 mg/dL (ref 0.50–1.10)
Calcium: 9.7 mg/dL (ref 8.4–10.5)
Chloride: 104 mEq/L (ref 96–112)
GLUCOSE: 67 mg/dL — AB (ref 70–99)
Potassium: 4.3 mEq/L (ref 3.5–5.3)
Sodium: 139 mEq/L (ref 135–145)
Total Protein: 7.4 g/dL (ref 6.0–8.3)

## 2014-10-03 LAB — LIPID PANEL
CHOLESTEROL: 201 mg/dL — AB (ref 0–200)
HDL: 60 mg/dL (ref >=46)
LDL Cholesterol: 127 mg/dL — ABNORMAL HIGH (ref 0–99)
TRIGLYCERIDES: 72 mg/dL (ref <150)
Total CHOL/HDL Ratio: 3.4 Ratio
VLDL: 14 mg/dL (ref 0–40)

## 2014-10-03 MED ORDER — ALPRAZOLAM 0.25 MG PO TABS: ORAL_TABLET | ORAL | Status: DC

## 2014-10-03 NOTE — Progress Notes (Signed)
Patient ID: Lindsay Collier, female    DOB: 05-04-1956, 59 y.o.   MRN: [id number]  PCP: Lindsay Streater, PA-C  Subjective:   Chief Complaint  Patient presents with  . Medication Refill    Alprazalone    HPI Presents for evaluation of chronic medical problems and needs a refill of alprazolam for anxiety.  She is doing well today with no complaints. Alprazolam helps calm her down and helps her sleep at night. She does not experience any nausea or vomiting when taking the medication.   She has dementia and regularly sees a neurologist. She most recently saw Dr. Frances FurbishAthar on 08/13/14 and increased her dose of Aricept from 5 mg to 10 mg. She does not have any side effects from the increased dose that she has noticed. Has not noticed any worsening of dementia. 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   She has been trying to gain weight over the past several months. She enjoys snacking throughout the day and eating bacon, sausage, and "other things that aren't healthy for me." She has been trying to drink more water throughout the day and cut back on soda. She also still smokes cigarettes regularly, about a half pack per day. She has not tried cutting back any further.  During her visit today, she went to use the bathroom and must have gotten confused about where she was. She ended up going outside and walking around the parking lot. We found her wandering out there and brought her back inside with her husband.    Review of Systems Constitutional: Negative for fever, chills, diaphoresis and fatigue.  HENT: Positive for congestion and rhinorrhea. Negative for ear pain, sinus pressure, sneezing and sore throat.  Eyes: Negative for pain.  Respiratory: Negative for cough and shortness of breath.  Cardiovascular: Negative for chest pain and palpitations.  Gastrointestinal: Negative for nausea, vomiting, diarrhea and constipation.  Genitourinary: Negative for dysuria, frequency  and hematuria.  Musculoskeletal: Negative for myalgias and arthralgias.  Neurological: Negative for dizziness, weakness, light-headedness and headaches. .    Patient Active Problem List   Diagnosis Date Noted  . Dementia 02/07/2014  . Tobacco use 02/07/2014  . Anxiety and depression 09/22/2012  . HTN (hypertension) 09/30/2011  . Hypercholesterolemia 09/30/2011     Prior to Admission medications   Medication Sig Start Date End Date Taking? Authorizing Provider  ALPRAZolam Prudy Feeler(XANAX) 0.25 MG tablet Take 1 tablet every day as needed for anxiety. PATIENT NEEDS OFFICE VISIT FOR ADDITIONAL REFILLS 09/13/14  Yes Lindsay Maeda S Casimira Sutphin, PA-C  cetirizine (ZYRTEC) 10 MG tablet Take 10 mg by mouth daily.   Yes Historical Provider, MD  donepezil (ARICEPT) 10 MG tablet Take 1 tablet (10 mg total) by mouth at bedtime. 08/13/14  Yes Lindsay FoleySaima Athar, MD  ferrous sulfate 325 (65 FE) MG tablet Take 325 mg by mouth daily with breakfast.   Yes Historical Provider, MD  fish oil-omega-3 fatty acids 1000 MG capsule Take 2 g by mouth daily.   Yes Historical Provider, MD  lisinopril-hydrochlorothiazide (PRINZIDE,ZESTORETIC) 20-25 MG per tablet Take 1 tablet by mouth daily. 11/20/13  Yes Lindsay Granberry S Jex Strausbaugh, PA-C  Multiple Vitamin (MULTIVITAMIN) tablet Take 1 tablet by mouth daily.   Yes Historical Provider, MD  OVER THE COUNTER MEDICATION Magnesium 250mg    Yes Historical Provider, MD  OVER THE COUNTER MEDICATION Vit D-3 1000iu   Yes Historical Provider, MD  OVER THE COUNTER MEDICATION Aloe Vera juice once daily   Yes Historical  Provider, MD  PARoxetine (PAXIL) 30 MG tablet Take 1 tablet (30 mg total) by mouth daily. 11/20/13  Yes Lindsay Lovering S Jonah Nestle, PA-C  simvastatin (ZOCOR) 20 MG tablet TAKE 1 TABLET (20 MG TOTAL) BY MOUTH EVERY EVENING. 11/20/13  Yes Lindsay Arizmendi S Aidaly Cordner, PA-C  ezetimibe (ZETIA) 10 MG tablet Take 1 tablet (10 mg total) by mouth daily. Patient not taking: Reported on 10/03/2014 11/21/13   Lindsay Brashelle S Shanyla Marconi, PA-C      Allergies  Allergen Reactions  . Codeine     Excessive sweating       Objective:  Physical Exam  Constitutional: She is oriented to person, place, and time. She appears well-developed and well-nourished. No distress.  BP 124/84 mmHg  Pulse 79  Temp(Src) 98.2 F (36.8 C) (Oral)  Resp 17  Ht 5\' 4"  (1.626 m)  Wt 126 lb (57.153 kg)  BMI 21.62 kg/m2  SpO2 96%  LMP 06/01/2006   Eyes: Conjunctivae are normal. No scleral icterus.  Neck: No thyromegaly present.  Cardiovascular: Normal rate, regular rhythm, normal heart sounds and intact distal pulses.   Pulmonary/Chest: Effort normal and breath sounds normal.  Lymphadenopathy:    She has no cervical adenopathy.  Neurological: She is alert and oriented to person, place, and time.  Skin: Skin is warm and dry.  Psychiatric: She has a normal mood and affect. Her speech is normal and behavior is normal.  During the interview and exam, there was no obvious evidence of her cognitive dysfunction. She does a good job with social conversation and behaviors, giving the impression that her memory is intact.           Assessment & Plan:   1. Essential hypertension Controlled. Continue current treatment. - CBC with Differential/Platelet  2. Hypercholesterolemia Await lab results. - Comprehensive metabolic panel - Lipid panel  3. Anxiety and depression Stable. Controlled. Continue current treatment. - ALPRAZolam (XANAX) 0.25 MG tablet; Take 1 tablet every day as needed for anxiety.  Dispense: 30 tablet; Refill: 0  4. Tobacco use Encouraged smoking cessation.  5. Dementia, without behavioral disturbance Continue Aricept and follow-up with Dr. Frances FurbishAthar as directed.  6. Screening for HIV (human immunodeficiency virus) - HIV antibody  Return in about 6 months (around 04/05/2015).   Lindsay Brashelle S. Steffanie Mingle, PA-C Physician Assistant-Certified [facility] .

## 2014-10-03 NOTE — Progress Notes (Signed)
Subjective:    Patient ID: Lindsay Collier, female    DOB: [birth year], 59 y.o.   MRN: [id number]  HPI  Lindsay Collier is a pleasant 59 year old female who presents today for a medication refill and lab work. Her husband is present with her.  She is doing well today with no complaints. She needs a refill of Xanax. The medication helps calm her down and helps her sleep at night. She does not experience any nausea or vomiting when taking the medication.   She has dementia and regularly sees a neurologist. She most recently saw Dr. Frances FurbishAthar on 08/13/14 and increased her dose of Aricept from 5 mg to 10 mg. She does not have any side effects from the increased dose that she has noticed. Has not noticed any worsening of dementia. 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   She has been trying to gain weight over the past several months. She enjoys snacking throughout the day and eating bacon, sausage, and "other things that aren't healthy for me." She has been trying to drink more water throughout the day and cut back on soda. She also still smokes cigarettes regularly, about a half pack per day. She has not tried cutting back any further.  During her visit today, she went to use the bathroom and must have gotten confused about where she was. She ended up going outside and walking around the parking lot. We found her wandering out there and brought her back inside with her husband.   Review of Systems  Constitutional: Negative for fever, chills, diaphoresis and fatigue.  HENT: Positive for congestion and rhinorrhea. Negative for ear pain, sinus pressure, sneezing and sore throat.   Eyes: Negative for pain.  Respiratory: Negative for cough and shortness of breath.   Cardiovascular: Negative for chest pain and palpitations.  Gastrointestinal: Negative for nausea, vomiting, diarrhea and constipation.  Genitourinary: Negative for dysuria, frequency and hematuria.  Musculoskeletal:  Negative for myalgias and arthralgias.  Neurological: Negative for dizziness, weakness, light-headedness and headaches.       Objective:   Physical Exam  Constitutional: She is oriented to person, place, and time. She appears well-developed and well-nourished. No distress.  BP 124/84 mmHg  Pulse 79  Temp(Src) 98.2 F (36.8 C) (Oral)  Resp 17  Ht 5\' 4"  (1.626 m)  Wt 126 lb (57.153 kg)  BMI 21.62 kg/m2  SpO2 96%  LMP 06/01/2006  HENT:  Head: Normocephalic.  Eyes: Conjunctivae are normal.  Neck: Neck supple.  Cardiovascular: Normal rate, regular rhythm and normal heart sounds.  Exam reveals no gallop and no friction rub.   No murmur heard. Pulmonary/Chest: Effort normal and breath sounds normal. She has no wheezes. She has no rhonchi. She has no rales.  Lymphadenopathy:       Head (right side): No submental, no submandibular and no tonsillar adenopathy present.       Head (left side): No submental, no submandibular and no tonsillar adenopathy present.    She has no cervical adenopathy.       Right: No supraclavicular adenopathy present.       Left: No supraclavicular adenopathy present.  Neurological: She is alert and oriented to person, place, and time.  Skin: Skin is warm and dry.  Psychiatric: She has a normal mood and affect. Her behavior is normal.      Assessment & Plan:  1. Essential hypertension Stable. Continue current medication regimen.  - CBC with  Differential/Platelet  2. Hypercholesterolemia Stable. Continue current medication regimen for now. No longer taking Zetia due to insurance costs. - Comprehensive metabolic panel - Lipid panel  3. Anxiety and depression Stable. Refill prescription.  - ALPRAZolam (XANAX) 0.25 MG tablet; Take 1 tablet every day as needed for anxiety.  Dispense: 30 tablet; Refill: 0  4. Tobacco use Advised her to stop smoking. She is trying to gain weight, and it was explained that if she stops smoking, she may notice an increase in  weight.  5. Dementia, without behavioral disturbance Continue current regimen. Sees neurology regularly. Increased dose form 5 mg to 10 mg on 08/13/14. No regression with this medication.   6. Screening for HIV (human immunodeficiency virus) - HIV antibody

## 2014-10-04 ENCOUNTER — Encounter: Payer: Self-pay | Admitting: Physician Assistant

## 2014-10-04 LAB — HIV ANTIBODY (ROUTINE TESTING W REFLEX): HIV 1&2 Ab, 4th Generation: NONREACTIVE

## 2014-11-10 ENCOUNTER — Other Ambulatory Visit: Payer: Self-pay | Admitting: Physician Assistant

## 2014-11-13 ENCOUNTER — Other Ambulatory Visit: Payer: Self-pay | Admitting: Physician Assistant

## 2014-11-13 DIAGNOSIS — F32A Depression, unspecified: Secondary | ICD-10-CM

## 2014-11-13 DIAGNOSIS — F419 Anxiety disorder, unspecified: Principal | ICD-10-CM

## 2014-11-13 DIAGNOSIS — F329 Major depressive disorder, single episode, unspecified: Secondary | ICD-10-CM

## 2014-11-13 DIAGNOSIS — I10 Essential (primary) hypertension: Secondary | ICD-10-CM

## 2014-11-13 NOTE — Telephone Encounter (Signed)
Advise on refill for Xanax. 

## 2014-11-15 NOTE — Telephone Encounter (Signed)
Meds ordered this encounter  Medications  . ALPRAZolam (XANAX) 0.25 MG tablet    Sig: TAKE 1 TABLET BY MOUTH AS NEEDED FOR ANXIETY    Dispense:  30 tablet    Refill:  0    Not to exceed 5 additional fills before 04/01/2015  . PARoxetine (PAXIL) 30 MG tablet    Sig: TAKE 1 TABLET (30 MG TOTAL) BY MOUTH DAILY.    Dispense:  90 tablet    Refill:  3  . lisinopril-hydrochlorothiazide (PRINZIDE,ZESTORETIC) 20-25 MG per tablet    Sig: TAKE 1 TABLET BY MOUTH DAILY.    Dispense:  90 tablet    Refill:  3      Printed at 104. Will bring to 102 after clinic.

## 2014-11-16 ENCOUNTER — Other Ambulatory Visit: Payer: Self-pay | Admitting: Physician Assistant

## 2014-11-16 ENCOUNTER — Telehealth: Payer: Self-pay

## 2014-11-16 NOTE — Telephone Encounter (Signed)
Refill on Alprazolam  [phone]

## 2014-11-16 NOTE — Telephone Encounter (Signed)
Faxed

## 2014-11-19 NOTE — Telephone Encounter (Signed)
I printed this on 6/14.

## 2014-11-20 NOTE — Telephone Encounter (Signed)
This was done (and faxed, I believe) on 6/14.  Please clarify with pharmacy and patient.

## 2014-11-28 ENCOUNTER — Other Ambulatory Visit: Payer: Self-pay | Admitting: Physician Assistant

## 2014-12-11 ENCOUNTER — Other Ambulatory Visit: Payer: Self-pay | Admitting: Physician Assistant

## 2014-12-11 ENCOUNTER — Other Ambulatory Visit: Payer: Self-pay | Admitting: Internal Medicine

## 2014-12-13 ENCOUNTER — Other Ambulatory Visit: Payer: Self-pay

## 2014-12-13 NOTE — Telephone Encounter (Signed)
Printed at 104. Will bring to 102 after clinic.  Meds ordered this encounter  Medications  . ALPRAZolam (XANAX) 0.25 MG tablet    Sig: TAKE 1 TABLET BY MOUTH AS NEEDED FOR ANXIETY    Dispense:  30 tablet    Refill:  0    Not to exceed 5 additional fills before 05/15/2015

## 2014-12-17 ENCOUNTER — Ambulatory Visit: Payer: PRIVATE HEALTH INSURANCE | Admitting: Neurology

## 2015-01-07 ENCOUNTER — Other Ambulatory Visit: Payer: Self-pay | Admitting: Physician Assistant

## 2015-01-09 ENCOUNTER — Other Ambulatory Visit: Payer: Self-pay | Admitting: Physician Assistant

## 2015-01-10 NOTE — Telephone Encounter (Signed)
Done

## 2015-01-11 NOTE — Telephone Encounter (Signed)
Faxed

## 2015-01-14 ENCOUNTER — Ambulatory Visit (INDEPENDENT_AMBULATORY_CARE_PROVIDER_SITE_OTHER): Payer: PRIVATE HEALTH INSURANCE | Admitting: Neurology

## 2015-01-14 ENCOUNTER — Encounter: Payer: Self-pay | Admitting: Neurology

## 2015-01-14 VITALS — BP 130/72 | HR 70 | Resp 16 | Ht 64.0 in | Wt 128.0 lb

## 2015-01-14 DIAGNOSIS — F0390 Unspecified dementia without behavioral disturbance: Secondary | ICD-10-CM

## 2015-01-14 DIAGNOSIS — F17200 Nicotine dependence, unspecified, uncomplicated: Secondary | ICD-10-CM

## 2015-01-14 DIAGNOSIS — Z720 Tobacco use: Secondary | ICD-10-CM

## 2015-01-14 MED ORDER — DONEPEZIL HCL 10 MG PO TABS: 10.0000 mg | ORAL_TABLET | Freq: Every day | ORAL | Status: DC

## 2015-01-14 NOTE — Patient Instructions (Signed)
I think overall you are doing well at this time: My suggestions for you today:  Remember to drink plenty of fluid, eat healthy meals and do not skip any meals. Try to eat protein with a every meal and eat a healthy snack such as fruit or nuts in between meals. Try to keep a regular sleep-wake schedule and try to exercise daily, particularly in the form of walking, 20-30 minutes a day, if you can. Good nutrition, proper sleep and exercise can help her cognitive function.  Engage in social activities in your community and with your family and try to keep up with current events by reading the newspaper or watching the news. If you have computer and can go online, try [employer]. Also, you may like to do word finding puzzles or crossword puzzles.  As far as your medications are concerned, I would like to suggest: continue to use Aricept 10 mg daily.   Please stop smoking. Monitor your driving ability.   I would like to see you back in 6 months, sooner if we need to. Please call us with any interim questions, concerns, problems, updates or refill requests.  Our phone number is [phone]. We also have an after hours call service for urgent matters and there is a physician on-call for urgent questions. For any emergencies you know to call 911 or go to the nearest emergency room.

## 2015-01-14 NOTE — Progress Notes (Signed)
Subjective:    Collier ID: Lindsay Collier is a 59 y.o. female.  HPI     Interim history:   Lindsay Collier is a 59 year-old right-handed woman with an underlying medical history of hyperlipidemia, hypertension, anxiety, depression, and smoking, who presents for follow-up consultation of Lindsay Collier memory loss. Lindsay Collier is accompanied by Lindsay Collier again today. I last saw Lindsay Collier on 60/14/2016, at which time Lindsay Collier reported that Lindsay Collier was able to tolerate Aricept 5 mg daily. Lindsay Collier had a recheck on Lindsay Collier thyroid function through Lindsay Collier primary care physician in September 2015. Lindsay Collier was still smoking. Lindsay Collier was still drinking a lot of sodas. Lindsay Collier was working and driving. Lindsay Collier did get lost one time. Lindsay Collier felt that Lindsay Collier memory was stable. Lindsay Collier MMSE was 20 out of 30, clock drawing was 1 out of 4 and animal fluency was 7 at Lindsay time. I asked Lindsay Collier to drink more water and stop smoking. I increased Lindsay Collier Aricept to 10 mg. I asked Lindsay Collier to observe Lindsay Collier driving as a did not feel Lindsay Collier was completely safe to drive. I talked to Lindsay Collier and Lindsay Collier extensively about Lindsay Collier driving last time.  Today, 01/14/2015: Lindsay Collier reports doing well. Lindsay Collier tries to drink plenty of water. Lindsay Collier drinks about 1 or 2 cups of soda per day. Lindsay Collier is active at work. Lindsay Collier is on Lindsay Collier feet a lot. Lindsay Collier drives about 20 minutes to work and from work. Lindsay Collier has observed Lindsay Collier driving and has no concerns. Lindsay Collier has been able to tolerate Aricept at 10 mg daily. Lindsay Collier mood is better. Lindsay Collier feels that Lindsay Paxil has helped. Unfortunately Lindsay Collier has not yet quit smoking.  Previously:  I first met Lindsay Collier on 01/15/2014 at Lindsay request of Lindsay Collier primary care provider, at which time Lindsay Collier reported a several year history of memory loss including forgetfulness, easy distractibility, difficulty focusing and following instructions and difficulty following through at work. He felt that Lindsay Collier memory loss started about 2-3 years ago, with gradual progression. At Lindsay time of Lindsay Collier first visit I suggested we  proceed with an MRI brain and formal cognitive testing and we also did some blood work. Lindsay Collier TSH was borderline low at 0.395. RPR was negative, vitamin B12 was elevated at 1600. We called and talked to Lindsay Collier about Lindsay Collier blood test results and advised him to pursue repeat thyroid function testing through Lindsay Collier primary care physician. Lindsay Collier MMSE at that time was 18, clock drawing was 2 out of 4, animal fluency was 11. Lindsay Collier was advised to increase Lindsay Collier water intake, reduce Lindsay Collier soda intake, and stop smoking. I did not start Lindsay Collier on any new medication at Lindsay time. Lindsay Collier had a brain MRI without contrast on 02/25/2014: Abnormal MRI brain showing  mild changes of chronic microvascular ischemia and  age disproportion generalized cerebral atrophy which appeared to have progressed compared with previous scan from 2012. In addition, personally reviewed Lindsay images through Lindsay PACS system. I also talked to Lindsay Collier on Lindsay phone on 03/12/2014 about Lindsay Collier MRI results and suggested we go ahead and start Lindsay Collier on Aricept 5 mg strength. I talked to him about potential side effects. Lindsay Collier was seen for a diagnostic interview by Dr. Valentina Shaggy on 05/07/2014 and I reviewed Lindsay report. Unfortunately, they did not pursue neuropsychological testing secondary to Lindsay cost factor as I understand.  Lindsay Collier drives, and has not gotten lost, no issues dressing, cooking.   Symptom onset was gradual and are now gradually worsening. Lindsay Collier  primarily has been having difficulty with short-term memory such as forgetfulness, misplacing things, asking Lindsay same question again and forgetting dates and events. Familiar faces are easily recognized. Lindsay Collier had a CTH wo contrast on 10/22/10 which was reported as normal, but MRA head showed mild cerebellar atrophy.   Lindsay Collier reports no recurrent headaches. There is family history of memory loss, perhaps, in Lindsay Collier younger brother and maybe Lindsay Collier parents, but Lindsay Collier is not sure.    There is no report of Auditory Hallucinations and Visual  Hallucinations and there are no delusions, such as paranoia.   Lindsay Collier has not been on any dementia medications. Lindsay Collier had some residual symptoms of depression and anxiety and had an increase in Lindsay Collier Paxil to 30 mg. Lindsay Collier denies suicidal ideations or homicidal ideations.   Lindsay Collier denies prior TIA or stroke symptoms, such as sudden onset of one sided weakness, numbness, tingling, slurring of speech or droopy face, hearing loss, tinnitus, diplopia or visual field cut or monocular loss of vision, and denies recurrent headaches.   Of note, Lindsay Collier reports snoring, and there is no report of witnessed apneas or choking sensations while asleep. Lindsay Collier drinks more than 3 Pepsis per day. Lindsay Collier smokes about 1/2 ppd.   Lindsay Collier Past Medical History Is Significant For: Past Medical History  Diagnosis Date  . Hypertension   . Hyperlipidemia   . Anxiety   . Smoker   . Anemia     Lindsay Collier Past Surgical History Is Significant For: Past Surgical History  Procedure Laterality Date  . Cesarean section    . Abdominal hysterectomy      total  . Tonsillectomy and adenoidectomy    . Colonoscopy      2012    Lindsay Collier Family History Is Significant For: Family History  Problem Relation Age of Onset  . Hypertension Father   . Heart disease Father   . Cancer Father     Prostate  . Hypertension Mother   . Asthma Son   . Diabetes Brother     Lindsay Collier Social History Is Significant For: Social History   Social History  . Marital Status: Married    Spouse Name: Lindsay Collier  . Number of Children: 1  . Years of Education: 12+   Occupational History  . Dietary Aid     [employer]  . DIETARY AIDE    Social History Main Topics  . Smoking status: Current Every Day Smoker    Types: Cigarettes  . Smokeless tobacco: Never Used     Comment: cutting back,currently smoking 1/2 pk daily  . Alcohol Use: No  . Drug Use: No  . Sexual Activity:    Partners: Male    Birth Control/ Protection: Surgical   Other Topics Concern   . None   Social History Narrative   Lives with Lindsay Collier and their son. Collier is right handed    Lindsay Collier Allergies Are:  Allergies  Allergen Reactions  . Codeine     Excessive sweating  :   Lindsay Collier Current Medications Are:  Outpatient Encounter Prescriptions as of 01/14/2015  Medication Sig  . ALPRAZolam (XANAX) 0.25 MG tablet TAKE 1 TABLET BY MOUTH AS NEEDED FOR ANXIETY  . cetirizine (ZYRTEC) 10 MG tablet Take 10 mg by mouth daily.  Marland Kitchen donepezil (ARICEPT) 10 MG tablet Take 1 tablet (10 mg total) by mouth at bedtime.  . ferrous sulfate 325 (65 FE) MG tablet Take 325 mg by mouth daily with breakfast.  . fish oil-omega-3 fatty  acids 1000 MG capsule Take 2 g by mouth daily.  Marland Kitchen lisinopril-hydrochlorothiazide (PRINZIDE,ZESTORETIC) 20-25 MG per tablet TAKE 1 TABLET BY MOUTH DAILY.  . Multiple Vitamin (MULTIVITAMIN) tablet Take 1 tablet by mouth daily.  Marland Kitchen OVER Lindsay COUNTER MEDICATION Magnesium 243m  . OVER Lindsay COUNTER MEDICATION Vit D-3 1000iu  . OVER Lindsay COUNTER MEDICATION Aloe Vera juice once daily  . PARoxetine (PAXIL) 30 MG tablet TAKE 1 TABLET (30 MG TOTAL) BY MOUTH DAILY.  . simvastatin (ZOCOR) 20 MG tablet Take 1 tablet (20 mg total) by mouth daily.  . [DISCONTINUED] ezetimibe (ZETIA) 10 MG tablet Take 1 tablet (10 mg total) by mouth daily. (Collier not taking: Reported on 10/03/2014)   No facility-administered encounter medications on file as of 01/14/2015.  :  Review of Systems:  Out of a complete 14 point review of systems, all are reviewed and negative with Lindsay exception of these symptoms as listed below:   Review of Systems  Neurological:       Collier reports that Lindsay Collier is tolerating Lindsay Aricept [address]. No new complaints.     Objective:  Neurologic Exam  Physical Exam Physical Examination:   Filed Vitals:   01/14/15 1552  BP: 130/72  Pulse: 70  Resp: 16    General Examination: Lindsay Collier is a very pleasant [age]o female in no acute distress. Lindsay Collier is calm and  cooperative with Lindsay exam but is much less anxious today. Lindsay Collier is in great spirits, in fact.   HEENT: Normocephalic, atraumatic, pupils are equal, round and reactive to light and accommodation. Funduscopic exam is normal with sharp disc margins noted. Extraocular tracking shows no saccadic breakdown without nystagmus noted. Hearing is intact. Tympanic membranes are obscured bilaterally with cerumen. Face is symmetric with no facial masking and normal facial sensation. There is no lip, neck or jaw tremor. Neck is not rigid with intact passive ROM. There are no carotid bruits on auscultation. Oropharynx exam reveals mild mouth dryness. Mild airway crowding is noted. Mallampati is class II. Tongue protrudes centrally and palate elevates symmetrically.    Chest: is clear to auscultation without wheezing, rhonchi or crackles noted.  Heart: sounds are regular and normal without murmurs, rubs or gallops noted.   Abdomen: is soft, non-tender and non-distended with normal bowel sounds appreciated on auscultation.  Extremities: There is no pitting edema in Lindsay distal lower extremities bilaterally. Pedal pulses are intact.   Skin: is warm and dry with no trophic changes noted. Age-related changes are noted on Lindsay skin.   Musculoskeletal: exam reveals no obvious joint deformities, tenderness or joint swelling or erythema.   Neurologically:  Mental status: Lindsay Collier is awake and alert, paying good  attention. Lindsay Collier is able to partially provide Lindsay history. Lindsay Collier provides details. Lindsay Collier is oriented to: person, place, time/date, situation, day of week, month of year and year. Lindsay Collier memory, attention, language and knowledge are impaired. There is no aphasia, agnosia, apraxia or anomia. There is a mild degree of bradyphrenia. Speech is not hypophonic with no dysarthria noted. Mood is congruent and affect is good today.    On 01/14/2015: MMSE 21/30, CDT: 1/4, AFT is 8/min.  On 08/13/2014: MMSE 20/30,  clock  drawing is 1 out of 4 and animal fluency is 7.   On 01/15/14: Lindsay Collier MMSE (Mini-Mental state exam) score is 18/30. CDT (Clock Drawing Test) score is 2/4. AFT (Animal Fluency Test) score is 11.   Cranial nerves are as described above under HEENT exam. In  addition, shoulder shrug is normal with equal shoulder height noted.  Motor exam: Normal bulk, and strength for age is noted. Tone is not rigid with absence of cogwheeling in Lindsay extremities. There is overall no bradykinesia. There is no drift or rebound. There is no resting tremor. There is a mild b/l UE postural and action tremor.  Romberg is negative. Reflexes are 3+ in Lindsay upper extremities and 3+ in Lindsay lower extremities. Toes are downgoing bilaterally. Fine motor skills: are unremarkable with finger to nose testing and hand movements.   Cerebellar testing shows no dysmetria or intention tremor on finger to nose testing. Heel to shin is unremarkable. There is no truncal or gait ataxia.   Sensory exam is intact to light touch, pinprick, vibration, temperature sense in Lindsay upper and lower extremities.   Gait, station and balance: Lindsay Collier stands up from Lindsay seated position with no difficulty and needs no help. No veering to one side is noted. No leaning to one side. Posture is not stooped. Stance is narrow-based. Lindsay Collier turns en block. Tandem walk is slightly difficult for Lindsay Collier, better than last time. Balance is not significantly impaired overall.   Assessment and Plan:  In summary, Lindsay Collier is a 59 year old female with an underlying medical history of hyperlipidemia, hypertension, anxiety, depression, and smoking, who presents for follow-up consultation of Lindsay Collier memory loss. Lindsay Collier history, laboratory testing, workup thus far indicates dementia without behavioral disturbance. Lindsay Collier is at risk for vascular dementia. We talked about Lindsay Collier brain MRI. We talked about Lindsay Collier recent blood test from August and September 2015. Lindsay Collier has been able to tolerate Aricept at 10 mg  strength generic. Memory scores are stable and I would like to keep Lindsay Collier on this dose. We talked about Lindsay Collier driving again today. Lindsay Collier has observed Lindsay Collier driving and feels that Lindsay Collier is okay to drive to and from work. It takes Lindsay Collier about 20 minutes to get to work. Lindsay Collier works as a Engineer, petroleum at [employer]. Lindsay Collier enjoys Lindsay Collier work. Lindsay Collier is active and on Lindsay Collier feet quite a bit at work. Lindsay Collier has reduced Lindsay Collier caffeine intake and has improved Lindsay Collier water intake. Unfortunately, Lindsay Collier still smokes about half a pack per day and have again strongly encouraged Lindsay Collier to quit smoking. We talked about a healthy lifestyle in general again today. Lindsay Collier is encouraged to eat at regular intervals and focus on protein intake and healthy snacks as well as good hydration. At this juncture, Lindsay Collier has remained fairly stable from 4 months ago and I suggested we do a six-month follow-up this time around. I renewed Lindsay Collier Aricept prescription and encouraged him to call with any interim questions or concerns. I answered all Lindsay Collier questions today and Lindsay Collier and Lindsay Collier were in agreement.  I spent 25 minutes in total face-to-face time with Lindsay Collier, more than 50% of which was spent in counseling and coordination of care, reviewing test results, reviewing medication and discussing or reviewing Lindsay diagnosis of dementia, its prognosis and treatment options as well as Lindsay Collier test results and Lindsay Collier driving.

## 2015-01-21 ENCOUNTER — Other Ambulatory Visit: Payer: Self-pay

## 2015-01-21 DIAGNOSIS — Z1231 Encounter for screening mammogram for malignant neoplasm of breast: Secondary | ICD-10-CM

## 2015-02-07 ENCOUNTER — Other Ambulatory Visit: Payer: Self-pay | Admitting: Internal Medicine

## 2015-02-11 ENCOUNTER — Ambulatory Visit
Admission: RE | Admit: 2015-02-11 | Discharge: 2015-02-11 | Disposition: A | Discharge status: Home / Self Care | Payer: PRIVATE HEALTH INSURANCE | Source: Ambulatory Visit

## 2015-02-11 DIAGNOSIS — Z1231 Encounter for screening mammogram for malignant neoplasm of breast: Secondary | ICD-10-CM

## 2015-02-24 ENCOUNTER — Other Ambulatory Visit: Payer: Self-pay | Admitting: Family Medicine

## 2015-03-11 ENCOUNTER — Other Ambulatory Visit: Payer: Self-pay | Admitting: Physician Assistant

## 2015-03-13 NOTE — Telephone Encounter (Signed)
Faxed

## 2015-03-26 ENCOUNTER — Other Ambulatory Visit: Payer: Self-pay | Admitting: Physician Assistant

## 2015-04-10 ENCOUNTER — Other Ambulatory Visit: Payer: Self-pay | Admitting: Physician Assistant

## 2015-04-11 NOTE — Telephone Encounter (Signed)
rx printed.  Meds ordered this encounter  Medications  . ALPRAZolam (XANAX) 0.25 MG tablet    Sig: TAKE 1 TABLET BY MOUTH EVERY DAY AS NEEDED FOR ANXIETY    Dispense:  30 tablet    Refill:  0    Not to exceed 5 additional fills before 09/09/2015

## 2015-04-27 ENCOUNTER — Other Ambulatory Visit: Payer: Self-pay | Admitting: Physician Assistant

## 2015-05-03 ENCOUNTER — Other Ambulatory Visit: Payer: Self-pay | Admitting: Physician Assistant

## 2015-05-11 ENCOUNTER — Other Ambulatory Visit: Payer: Self-pay | Admitting: Physician Assistant

## 2015-05-13 NOTE — Telephone Encounter (Signed)
Meds ordered this encounter  Medications  . ALPRAZolam (XANAX) 0.25 MG tablet    Sig: TAKE 1 TABLET BY MOUTH EVERY DAY AS NEEDED FOR ANXIETY    Dispense:  30 tablet    Refill:  0    Not to exceed 5 additional fills before 10/09/2015

## 2015-05-14 ENCOUNTER — Other Ambulatory Visit: Payer: Self-pay | Admitting: Physician Assistant

## 2015-05-14 NOTE — Telephone Encounter (Signed)
Rx faxed

## 2015-06-04 ENCOUNTER — Other Ambulatory Visit: Payer: Self-pay | Admitting: Physician Assistant

## 2015-06-05 ENCOUNTER — Other Ambulatory Visit: Payer: Self-pay | Admitting: Physician Assistant

## 2015-06-06 NOTE — Telephone Encounter (Signed)
Meds ordered this encounter  Medications  . ALPRAZolam (XANAX) 0.25 MG tablet    Sig: TAKE 1 TABLET BY MOUTH EVERY DAY AS NEEDED FOR ANXIETY    Dispense:  30 tablet    Refill:  0    Not to exceed 5 additional fills before 11/12/2015

## 2015-06-07 NOTE — Telephone Encounter (Signed)
Faxed

## 2015-06-17 ENCOUNTER — Other Ambulatory Visit: Payer: Self-pay | Admitting: Physician Assistant

## 2015-07-09 ENCOUNTER — Other Ambulatory Visit: Payer: Self-pay | Admitting: Physician Assistant

## 2015-07-09 NOTE — Telephone Encounter (Signed)
Rx printed at 104. Will bring to 102 after clinic.  Meds ordered this encounter  Medications  . ALPRAZolam (XANAX) 0.25 MG tablet    Sig: TAKE 1 TABLET EVERY DAY AS NEEDED FOR ANXIETY    Dispense:  30 tablet    Refill:  0    Not to exceed 5 additional fills before 12/04/2015

## 2015-07-10 NOTE — Telephone Encounter (Signed)
Faxed

## 2015-07-17 ENCOUNTER — Ambulatory Visit: Payer: PRIVATE HEALTH INSURANCE | Admitting: Neurology

## 2015-07-29 ENCOUNTER — Encounter: Payer: Self-pay | Admitting: Neurology

## 2015-07-29 ENCOUNTER — Ambulatory Visit (INDEPENDENT_AMBULATORY_CARE_PROVIDER_SITE_OTHER): Payer: PRIVATE HEALTH INSURANCE | Admitting: Neurology

## 2015-07-29 VITALS — BP 110/62 | HR 78 | Resp 16 | Ht 64.0 in | Wt 132.0 lb

## 2015-07-29 DIAGNOSIS — Z720 Tobacco use: Secondary | ICD-10-CM

## 2015-07-29 DIAGNOSIS — F17200 Nicotine dependence, unspecified, uncomplicated: Secondary | ICD-10-CM

## 2015-07-29 DIAGNOSIS — F0390 Unspecified dementia without behavioral disturbance: Secondary | ICD-10-CM

## 2015-07-29 MED ORDER — DONEPEZIL HCL 10 MG PO TABS: 10.0000 mg | ORAL_TABLET | Freq: Every day | ORAL | Status: DC

## 2015-07-29 NOTE — Progress Notes (Signed)
Subjective:    Patient ID: Lindsay Collier is a 60 y.o. female.  HPI     Interim history:   [name] is a 60 year-old right-handed woman with an underlying medical history of hyperlipidemia, hypertension, anxiety, depression, and smoking, who presents for follow-up consultation of her memory loss. The patient is accompanied by her husband again today. I last saw her on 01/14/2015, at which time she reported doing well. She was trying to drink enough water. She was still drinking some soda. She was active at work, on her feet a lot. She was driving about 20 minutes to work and from work. Her husband had observed her driving and had no new concerns. She was able to tolerate Aricept at 10 mg daily. Her mood was better. She felt that the Paxil had helped. Unfortunately, she was still smoking. Her MMSE was 21/30, CDT was 1/4, and AFT was 8/min at the time.   Today, 07/29/15: She reports doing well, no new issues, still smokes, but less than 1/2 ppd. Trying to be more active, her husband reports that he is trying to get her to walk with him. He feels that the fresh air in the activity outside helps her. Se still works. No driving issues to and from work. No recent illness. Sometimes she has leg cramps. She tries to eat fruit. She takes a magnesium supplement.  Previously:  I saw her on 08/13/2014, at which time her husband reported that she was able to tolerate Aricept 5 mg daily. She had a recheck on her thyroid function through her primary care physician in September 2015. She was still smoking. She was still drinking a lot of sodas. She was working and driving. She did get lost one time. She felt that her memory was stable. Her MMSE was 20 out of 30, clock drawing was 1 out of 4 and animal fluency was 7 at the time. I asked her to drink more water and stop smoking. I increased her Aricept to 10 mg. I asked her husband to observe her driving as a did not feel she was completely safe to drive. I talked to  her and her husband extensively about her driving last time.  I first met her on 01/15/2014 at the request of her primary care provider, at which time her husband reported a several year history of memory loss including forgetfulness, easy distractibility, difficulty focusing and following instructions and difficulty following through at work. He felt that her memory loss started about 2-3 years ago, with gradual progression. At the time of her first visit I suggested we proceed with an MRI brain and formal cognitive testing and we also did some blood work. Her TSH was borderline low at 0.395. RPR was negative, vitamin B12 was elevated at 1600. We called and talked to her husband about her blood test results and advised him to pursue repeat thyroid function testing through her primary care physician. Her MMSE at that time was 18, clock drawing was 2 out of 4, animal fluency was 11. She was advised to increase her water intake, reduce her soda intake, and stop smoking. I did not start her on any new medication at the time. She had a brain MRI without contrast on 02/25/2014: Abnormal MRI brain showing  mild changes of chronic microvascular ischemia and  age disproportion generalized cerebral atrophy which appeared to have progressed compared with previous scan from 2012. In addition, personally reviewed the images through the PACS system. I also talked  to her husband on the phone on 03/12/2014 about her MRI results and suggested we go ahead and start her on Aricept 5 mg strength. I talked to him about potential side effects. She was seen for a diagnostic interview by Dr. Valentina Shaggy on 05/07/2014 and I reviewed the report. Unfortunately, they did not pursue neuropsychological testing secondary to the cost factor as I understand.  She drives, and has not gotten lost, no issues dressing, cooking.   Symptom onset was gradual and are now gradually worsening. She primarily has been having difficulty with short-term memory  such as forgetfulness, misplacing things, asking the same question again and forgetting dates and events. Familiar faces are easily recognized. She had a CTH wo contrast on 10/22/10 which was reported as normal, but MRA head showed mild cerebellar atrophy.   She reports no recurrent headaches. There is family history of memory loss, perhaps, in her younger brother and maybe her parents, but she is not sure.    There is no report of Auditory Hallucinations and Visual Hallucinations and there are no delusions, such as paranoia.   She has not been on any dementia medications. She had some residual symptoms of depression and anxiety and had an increase in her Paxil to 30 mg. She denies suicidal ideations or homicidal ideations.   The patient denies prior TIA or stroke symptoms, such as sudden onset of one sided weakness, numbness, tingling, slurring of speech or droopy face, hearing loss, tinnitus, diplopia or visual field cut or monocular loss of vision, and denies recurrent headaches.   Of note, the patient reports snoring, and there is no report of witnessed apneas or choking sensations while asleep. She drinks more than 3 Pepsis per day. She smokes about 1/2 ppd.     Her Past Medical History Is Significant For: Past Medical History  Diagnosis Date  . Hypertension   . Hyperlipidemia   . Anxiety   . Smoker   . Anemia     Her Past Surgical History Is Significant For: Past Surgical History  Procedure Laterality Date  . Cesarean section    . Abdominal hysterectomy      total  . Tonsillectomy and adenoidectomy    . Colonoscopy      2012    Her Family History Is Significant For: Family History  Problem Relation Age of Onset  . Hypertension Father   . Heart disease Father   . Cancer Father     Prostate  . Hypertension Mother   . Asthma Son   . Diabetes Brother     Her Social History Is Significant For: Social History   Social History  . Marital Status: Married    Spouse Name:  Lindsay Collier  . Number of Children: 1  . Years of Education: 12+   Occupational History  . Dietary Aid     [employer]  . DIETARY AIDE    Social History Main Topics  . Smoking status: Current Every Day Smoker    Types: Cigarettes  . Smokeless tobacco: Never Used     Comment: cutting back,currently smoking 1/2 pk daily  . Alcohol Use: No  . Drug Use: No  . Sexual Activity:    Partners: Male    Birth Control/ Protection: Surgical   Other Topics Concern  . None   Social History Narrative   Lives with her husband and their son. Patient is right handed    Her Allergies Are:  Allergies  Allergen Reactions  . Codeine  Excessive sweating  :   Her Current Medications Are:  Outpatient Encounter Prescriptions as of 07/29/2015  Medication Sig  . ALPRAZolam (XANAX) 0.25 MG tablet TAKE 1 TABLET EVERY DAY AS NEEDED FOR ANXIETY  . cetirizine (ZYRTEC) 10 MG tablet Take 10 mg by mouth daily.  Marland Kitchen donepezil (ARICEPT) 10 MG tablet Take 1 tablet (10 mg total) by mouth at bedtime.  . ferrous sulfate 325 (65 FE) MG tablet Take 325 mg by mouth daily with breakfast.  . fish oil-omega-3 fatty acids 1000 MG capsule Take 2 g by mouth daily.  Marland Kitchen lisinopril-hydrochlorothiazide (PRINZIDE,ZESTORETIC) 20-25 MG per tablet TAKE 1 TABLET BY MOUTH DAILY.  . Multiple Vitamin (MULTIVITAMIN) tablet Take 1 tablet by mouth daily.  Marland Kitchen OVER THE COUNTER MEDICATION Magnesium 250m  . OVER THE COUNTER MEDICATION Vit D-3 1000iu  . OVER THE COUNTER MEDICATION Aloe Vera juice once daily  . PARoxetine (PAXIL) 30 MG tablet TAKE 1 TABLET (30 MG TOTAL) BY MOUTH DAILY.  . simvastatin (ZOCOR) 20 MG tablet TAKE 1 TABLET BY MOUTH EVERY DAY   No facility-administered encounter medications on file as of 07/29/2015.  :  Review of Systems:  Out of a complete 14 point review of systems, all are reviewed and negative with the exception of these symptoms as listed below:   Review of Systems  Neurological:       Patient is  here for f/u. No new concerns. No problems taking Donepezil.     Objective:  Neurologic Exam  Physical Exam Physical Examination:   Filed Vitals:   07/29/15 1307  BP: 110/62  Pulse: 78  Resp: 16    General Examination: The patient is a very pleasant 60yo female in no acute distress. She is calm and cooperative with the exam, very mildly anxious today. She is in good spirits today.    HEENT: Normocephalic, atraumatic, pupils are equal, round and reactive to light and accommodation. Funduscopic exam is normal with sharp disc margins noted. Mild b/l cataracts. Extraocular tracking shows no saccadic breakdown without nystagmus noted. Hearing is intact. Face is symmetric with no facial masking and normal facial sensation. There is no lip, neck or jaw tremor. Neck is not rigid with intact passive ROM. There are no carotid bruits on auscultation. Oropharynx exam reveals mild mouth dryness. Mild airway crowding is noted. Mallampati is class II. Tongue protrudes centrally and palate elevates symmetrically.    Chest: is clear to auscultation without wheezing, rhonchi or crackles noted.  Heart: sounds are regular and normal without murmurs, rubs or gallops noted.   Abdomen: is soft, non-tender and non-distended with normal bowel sounds appreciated on auscultation.  Extremities: There is no pitting edema in the distal lower extremities bilaterally. Pedal pulses are intact.   Skin: is warm and dry with no trophic changes noted. Age-related changes are noted on the skin.   Musculoskeletal: exam reveals no obvious joint deformities, tenderness or joint swelling or erythema.   Neurologically:  Mental status: The patient is awake and alert, paying good  attention. She is able to partially provide the history. Her husband provides details. She is oriented to: person, place, time/date, situation, day of week, month of year and year. Her memory, attention, language and knowledge are impaired. There is  no aphasia, agnosia, apraxia or anomia. There is a mild degree of bradyphrenia. Speech is not hypophonic with no dysarthria noted. Mood is congruent and affect is good today.    On 07/29/2015: MMSE: 27/30, CDT: 1/4, AFT: 17/min.  On 01/14/2015: MMSE 21/30, CDT: 1/4, AFT is 8/min.  On 08/13/2014: MMSE 20/30,  clock drawing is 1 out of 4 and animal fluency is 7.   On 01/15/14: Her MMSE (Mini-Mental state exam) score is 18/30. CDT (Clock Drawing Test) score is 2/4. AFT (Animal Fluency Test) score is 11.   Cranial nerves are as described above under HEENT exam. In addition, shoulder shrug is normal with equal shoulder height noted.  Motor exam: Normal bulk, and strength for age is noted. Tone is not rigid with absence of cogwheeling in the extremities. There is overall no bradykinesia. There is no drift or rebound. There is no resting tremor. There is a mild b/l UE postural and action tremor, stable and unchanged.  Romberg is negative. Reflexes are 2+ in the upper extremities and 3+ in the lower extremities. Fine motor skills: are unremarkable with finger to nose testing and hand movements.   Cerebellar testing shows no dysmetria or intention tremor on finger to nose testing. Heel to shin is unremarkable. There is no truncal or gait ataxia.   Sensory exam is intact to light touch, pinprick, vibration, temperature sense in the upper and lower extremities.   Gait, station and balance: She stands up from the seated position with no difficulty and needs no help. No veering to one side is noted. No leaning to one side. Posture is not stooped. Stance is narrow-based. She turns en block. Tandem walk is slightly difficult for her, stable. Balance is not significantly impaired overall.   Assessment and Plan:  In summary, Lindsay Collier is a 60 year old female with an underlying medical history of hyperlipidemia, hypertension, anxiety, depression, and smoking, who presents for follow-up consultation of her  dementia without behavioral disturbance. She is at risk for vascular dementia. She had a brain MRI in the past. She had blood work. She has been able to tolerate Aricept at 10 mg strength generic. Memory scores have been mostly stable for the past visits, actually better today. I would like to keep her donepezil 10 mg daily. We talked about her driving again today. Her husband has observed her driving and feels that she is okay to drive to and from work. It takes her about 20 minutes in one direction. 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 She is active and on her feet quite a bit at work. She has reduced her caffeine intake and has improved her water intake. Unfortunately, she still smokes, but less than half a pack per day. I have again strongly encouraged her to quit smoking. We talked about a healthy lifestyle in general again today. She is encouraged to eat at regular intervals and focus on protein intake and healthy snacks as well as good hydration. At this juncture, she has stable and reassuringly, her memory scores are actually better. We will to a 6 month follow-up, sooner if needed. I renewed her Aricept prescription for generic medicine 10 mg strength once daily, I have strongly encouraged him to make a sooner appointment if needed and they can always call us with interim questions or concerns. I spent 20 minutes in total face-to-face time with the patient, more than 50% of which was spent in counseling and coordination of care, reviewing test results, reviewing medication and discussing or reviewing the diagnosis of dementia, its prognosis and treatment options as well as her test results and her driving.

## 2015-07-29 NOTE — Patient Instructions (Signed)
I think overall you are doing fairly well but I do want to suggest a few things today:  Remember to drink plenty of fluid, eat healthy meals and do not skip any meals. Try to eat protein with a every meal and eat a healthy snack such as fruit or nuts in between meals. Try to keep a regular sleep-wake schedule and try to exercise daily, particularly in the form of walking, 20-30 minutes a day, if you can. Good nutrition, proper sleep and exercise can help your memory and cognitive function.  Monitor your driving abilities and have your family or spouse observe your driving skills from time to time.   Engage in social activities in your community and with your family and try to keep up with current events by reading the newspaper or watching the news. If you have computer and can go online, try [employer]. Also, you may like to do word finding puzzles or crossword puzzles.  As far as your medications are concerned, I would like to suggest no changes.    As far as diagnostic testing: no test needed at this time.   I would like to see you back in 6 months, sooner if we need to. Please call us with any interim questions, concerns, problems, updates or refill requests.  Lindsay Collier is my nurse and will answer any of your questions and relay your messages to me and also relay my messages to you.  Our phone number is [phone]. We also have an after hours call service for urgent matters and there is a physician on-call for urgent questions. For any emergencies you know to call 911 or go to the nearest emergency room.

## 2015-08-08 ENCOUNTER — Other Ambulatory Visit: Payer: Self-pay | Admitting: Physician Assistant

## 2015-08-09 NOTE — Telephone Encounter (Signed)
Meds ordered this encounter  Medications  . ALPRAZolam (XANAX) 0.25 MG tablet    Sig: TAKE 1 TABLET BY MOUTH DAILY AS NEEDED FOR ANXIETY    Dispense:  30 tablet    Refill:  0    Not to exceed 5 additional fills before 01/06/2016

## 2015-08-12 ENCOUNTER — Other Ambulatory Visit: Payer: Self-pay | Admitting: Physician Assistant

## 2015-09-04 ENCOUNTER — Other Ambulatory Visit: Payer: Self-pay | Admitting: Physician Assistant

## 2015-09-05 NOTE — Telephone Encounter (Signed)
Meds ordered this encounter  Medications  . ALPRAZolam (XANAX) 0.25 MG tablet    Sig: TAKE 1 TABLET EVERY DAY AS NEEDED FOR ANXIETY    Dispense:  30 tablet    Refill:  0    Not to exceed 5 additional fills before 02/09/2016

## 2015-09-09 NOTE — Telephone Encounter (Signed)
Faxed

## 2015-09-23 ENCOUNTER — Telehealth: Payer: Self-pay | Admitting: Physician Assistant

## 2015-09-23 ENCOUNTER — Ambulatory Visit (INDEPENDENT_AMBULATORY_CARE_PROVIDER_SITE_OTHER): Payer: PRIVATE HEALTH INSURANCE | Admitting: Physician Assistant

## 2015-09-23 VITALS — BP 114/70 | HR 91 | Temp 98.2°F | Resp 18 | Ht 64.0 in | Wt 127.4 lb

## 2015-09-23 DIAGNOSIS — E7800 Pure hypercholesterolemia, unspecified: Secondary | ICD-10-CM | POA: Diagnosis not present

## 2015-09-23 DIAGNOSIS — F419 Anxiety disorder, unspecified: Secondary | ICD-10-CM

## 2015-09-23 DIAGNOSIS — Z720 Tobacco use: Secondary | ICD-10-CM

## 2015-09-23 DIAGNOSIS — F329 Major depressive disorder, single episode, unspecified: Secondary | ICD-10-CM

## 2015-09-23 DIAGNOSIS — Z23 Encounter for immunization: Secondary | ICD-10-CM

## 2015-09-23 DIAGNOSIS — I10 Essential (primary) hypertension: Secondary | ICD-10-CM | POA: Diagnosis not present

## 2015-09-23 DIAGNOSIS — F32A Depression, unspecified: Secondary | ICD-10-CM

## 2015-09-23 DIAGNOSIS — F0390 Unspecified dementia without behavioral disturbance: Secondary | ICD-10-CM

## 2015-09-23 DIAGNOSIS — F418 Other specified anxiety disorders: Secondary | ICD-10-CM

## 2015-09-23 LAB — CBC WITH DIFFERENTIAL/PLATELET
BASOS PCT: 1 %
Basophils Absolute: 61 cells/uL (ref 0–200)
EOS PCT: 2 %
Eosinophils Absolute: 122 cells/uL (ref 15–500)
HCT: 43.5 % (ref 35.0–45.0)
Hemoglobin: 14.7 g/dL (ref 11.7–15.5)
LYMPHS PCT: 10 %
Lymphs Abs: 610 cells/uL — ABNORMAL LOW (ref 850–3900)
MCH: 34.8 pg — ABNORMAL HIGH (ref 27.0–33.0)
MCHC: 33.8 g/dL (ref 32.0–36.0)
MCV: 103.1 fL — ABNORMAL HIGH (ref 80.0–100.0)
MONOS PCT: 9 %
MPV: 10.7 fL (ref 7.5–12.5)
Monocytes Absolute: 549 cells/uL (ref 200–950)
Neutro Abs: 4758 cells/uL (ref 1500–7800)
Neutrophils Relative %: 78 %
PLATELETS: 298 10*3/uL (ref 140–400)
RBC: 4.22 MIL/uL (ref 3.80–5.10)
RDW: 13.2 % (ref 11.0–15.0)
WBC: 6.1 10*3/uL (ref 3.8–10.8)

## 2015-09-23 LAB — LIPID PANEL
Cholesterol: 257 mg/dL — ABNORMAL HIGH (ref 125–200)
HDL: 46 mg/dL (ref >=46)
LDL Cholesterol: 181 mg/dL — ABNORMAL HIGH (ref <130)
TRIGLYCERIDES: 148 mg/dL (ref <150)
Total CHOL/HDL Ratio: 5.6 Ratio — ABNORMAL HIGH (ref <=5.0)
VLDL: 30 mg/dL (ref <30)

## 2015-09-23 LAB — COMPREHENSIVE METABOLIC PANEL
ALK PHOS: 95 U/L (ref 33–130)
ALT: 14 U/L (ref 6–29)
AST: 26 U/L (ref 10–35)
Albumin: 4.3 g/dL (ref 3.6–5.1)
BILIRUBIN TOTAL: 0.3 mg/dL (ref 0.2–1.2)
BUN: 13 mg/dL (ref 7–25)
CALCIUM: 9.9 mg/dL (ref 8.6–10.4)
CO2: 26 mmol/L (ref 20–31)
Chloride: 105 mmol/L (ref 98–110)
Creat: 0.72 mg/dL (ref 0.50–0.99)
Glucose, Bld: 87 mg/dL (ref 65–99)
POTASSIUM: 4.5 mmol/L (ref 3.5–5.3)
Sodium: 141 mmol/L (ref 135–146)
TOTAL PROTEIN: 7 g/dL (ref 6.1–8.1)

## 2015-09-23 LAB — TSH: TSH: 0.72 mIU/L

## 2015-09-23 MED ORDER — ALPRAZOLAM 0.25 MG PO TABS: ORAL_TABLET | ORAL | Status: DC

## 2015-09-23 MED ORDER — ZOSTER VACCINE LIVE 19400 UNT/0.65ML ~~LOC~~ SOLR: 0.6500 mL | Freq: Once | SUBCUTANEOUS | Status: DC

## 2015-09-23 MED ORDER — PAROXETINE HCL 30 MG PO TABS: ORAL_TABLET | ORAL | Status: DC

## 2015-09-23 MED ORDER — LISINOPRIL-HYDROCHLOROTHIAZIDE 20-25 MG PO TABS: 1.0000 | ORAL_TABLET | Freq: Every day | ORAL | Status: DC

## 2015-09-23 MED ORDER — SIMVASTATIN 20 MG PO TABS: ORAL_TABLET | ORAL | Status: DC

## 2015-09-23 NOTE — Progress Notes (Signed)
Subjective:    Patient ID: Lindsay Collier, female    DOB: March 15, 1956, 60 y.o.   MRN: [id number]  Chief Complaint  Patient presents with  . Medication Refill  . Lab Work    blood work and pap smear    HPI  Patient presents today with her husband for medication refill and lab work.   She has been doing well with no complaints. Her husband states she needs all of her medication refilled.   Patient has dementia and is followed by Dr. Frances FurbishAthar with Neurology.  She last saw Dr. Frances FurbishAthar in February 2017. At that time her memory scores were stable and actually improved at the last visit. She continues to take the Donepezil 10mg  QD without any side effects. They also discussed patient ability to drive at their last neurology visit. Dr. Frances FurbishAthar was ok with patient driving to and from work.   Patient continues to smoke and has been encouraged to quite several times. She smokes approxt a half a pack a day.   She also needs her blood work competed today. She last had labs drawn in May 2016. At that time her total cholesterol was mildly elevated at 201 and LDL was 127.   Patient husband is requesting that we give patient a 90 days supply of the simvastatin and alprazolam.   Current Outpatient Prescriptions on File Prior to Visit  Medication Sig Dispense Refill  . donepezil (ARICEPT) 10 MG tablet Take 1 tablet (10 mg total) by mouth at bedtime. 30 tablet 12  . ferrous sulfate 325 (65 FE) MG tablet Take 325 mg by mouth daily with breakfast.    . fish oil-omega-3 fatty acids 1000 MG capsule Take 2 g by mouth daily.    . Multiple Vitamin (MULTIVITAMIN) tablet Take 1 tablet by mouth daily.    Marland Kitchen. OVER THE COUNTER MEDICATION Magnesium 250mg     . OVER THE COUNTER MEDICATION Vit D-3 1000iu    . OVER THE COUNTER MEDICATION Aloe Vera juice once daily    . cetirizine (ZYRTEC) 10 MG tablet Take 10 mg by mouth daily. Reported on 09/23/2015     No current facility-administered medications on file prior to visit.    Allergies  Allergen Reactions  . Codeine     Excessive sweating     Review of Systems  Constitutional: Negative for fever and chills.  HENT: Negative.   Respiratory: Negative for cough and shortness of breath.   Cardiovascular: Negative for chest pain.  Gastrointestinal: Negative for nausea, vomiting, abdominal pain and diarrhea.  Genitourinary: Negative for dysuria and hematuria.  Neurological: Negative for dizziness, syncope, weakness, numbness and headaches.  Psychiatric/Behavioral: Negative for confusion.       Objective:   Physical Exam  Constitutional: She is oriented to person, place, and time. She appears well-developed and well-nourished. No distress.  HENT:  Head: Normocephalic and atraumatic.  Right Ear: External ear normal.  Left Ear: External ear normal.  Nose: Nose normal.  Mouth/Throat: Oropharynx is clear and moist.  Eyes: Pupils are equal, round, and reactive to light.  Neck: Normal range of motion. Neck supple. No thyromegaly present.  Cardiovascular: Normal rate, regular rhythm, normal heart sounds and intact distal pulses.   Pulses:      Radial pulses are 2+ on the right side, and 2+ on the left side.       Dorsalis pedis pulses are 2+ on the right side, and 2+ on the left side.  Pulmonary/Chest: Effort normal and breath  sounds normal.  Abdominal: Soft. Bowel sounds are normal. There is no tenderness.  Lymphadenopathy:    She has no cervical adenopathy.  Neurological: She is alert and oriented to person, place, and time. She has normal reflexes.  Reflex Scores:      Bicep reflexes are 2+ on the right side and 2+ on the left side.      Patellar reflexes are 2+ on the right side and 2+ on the left side. Skin: Skin is warm and dry.  Psychiatric: She has a normal mood and affect. Her behavior is normal. Judgment and thought content normal.  Mild confusion noted, but she is alert and oriented          Assessment & Plan:  1. Essential  hypertension Awaiting labs. Refill medication. - lisinopril-hydrochlorothiazide (PRINZIDE,ZESTORETIC) 20-25 MG tablet; Take 1 tablet by mouth daily.  Dispense: 90 tablet; Refill: 3 - CBC with Differential/Platelet - TSH - Comprehensive metabolic panel  2. Hypercholesterolemia Awaiting labs. Refill medication. - simvastatin (ZOCOR) 20 MG tablet; TAKE 1 TABLET BY MOUTH EVERY DAY  Dispense: 90 tablet; Refill: 3 - Lipid panel  3. Tobacco use Cessation education.  4. Dementia, without behavioral disturbance Continue to follow recs from Dr. Frances Furbish with neurology.  5. Anxiety and depression Under control. Continue current medication regimen. - PARoxetine (PAXIL) 30 MG tablet; TAKE 1 TABLET (30 MG TOTAL) BY MOUTH DAILY.  Dispense: 90 tablet; Refill: 3 - ALPRAZolam (XANAX) 0.25 MG tablet; TAKE 1 TABLET EVERY DAY AS NEEDED FOR ANXIETY  Dispense: 30 tablet; Refill: 0 - ALPRAZolam (XANAX) 0.25 MG tablet; TAKE 1 TABLET EVERY DAY AS NEEDED FOR ANXIETY  Dispense: 30 tablet; Refill: 0 - ALPRAZolam (XANAX) 0.25 MG tablet; TAKE 1 TABLET EVERY DAY AS NEEDED FOR ANXIETY  Dispense: 30 tablet; Refill: 0  6. Need for shingles vaccine Prescription for vaccine to take to pharmacy. - zoster vaccine live, PF, (ZOSTAVAX) 16109 UNT/0.65ML injection; Inject 19,400 Units into the skin once.  Dispense: 0.65 mL; Refill: 0  Azucena Kuba PA-S 09/23/2015

## 2015-09-23 NOTE — Patient Instructions (Signed)
     IF you received an x-ray today, you will receive an invoice from [facility]. Please contact  Radiology at [phone] with questions or concerns regarding your invoice.   IF you received labwork today, you will receive an invoice from [facility]. Please contact [facility] at [phone] with questions or concerns regarding your invoice.   Our billing staff will not be able to assist you with questions regarding bills from these companies.  You will be contacted with the lab results as soon as they are available. The fastest way to get your results is to activate your My Chart account. Instructions are located on the last page of this paperwork. If you have not heard from us regarding the results in 2 weeks, please contact this office.      

## 2015-09-23 NOTE — Telephone Encounter (Signed)
Called and spoke with patient's husband to discuss lab results. Patient's cholesterol is elevated. Husband states she has not taken her Zocor since February because she has been out. She needed to see PCP before she could refill her medication. [facility]. Husband states they he will pick her medication up from the pharmacy this afternoon and she will resume taking the Zocor.

## 2015-09-23 NOTE — Progress Notes (Signed)
Patient ID: Lindsay Collier, female    DOB: 1956/04/08, 60 y.o.   MRN: [id number]  PCP: Jevan Gaunt, PA-C  Subjective:   Chief Complaint  Patient presents with  . Medication Refill  . Lab Work    blood work and pap smear    HPI Patient presents today with her husband for medication refill and lab work.   She has been doing well with no complaints. Her husband states she needs all of her medication refilled.   Patient has dementia and is followed by Dr. Frances Furbish with Neurology. She last saw Dr. Frances Furbish in February 2017. At that time her memory scores were stable and actually improved at the last visit. She continues to take the Donepezil  QD without any side effects. They also discussed patient ability to drive at their last neurology visit. Dr. Frances Furbish was ok with patient driving to and from work.   Patient continues to smoke and has been encouraged to quite several times. She smokes approxt a half a pack a day.   She also needs her blood work competed today. She last had labs drawn in May 2016. At that time her total cholesterol was mildly elevated at 201 and LDL was 127.   Patient husband is requesting that we give patient a 90 days supply of the simvastatin and alprazolam     Review of Systems Constitutional: Negative for fever and chills.  HENT: Negative.  Respiratory: Negative for cough and shortness of breath.  Cardiovascular: Negative for chest pain.  Gastrointestinal: Negative for nausea, vomiting, abdominal pain and diarrhea.  Genitourinary: Negative for dysuria and hematuria.  Neurological: Negative for dizziness, syncope, weakness, numbness and headaches.  Psychiatric/Behavioral: Negative for confusion.     Patient Active Problem List   Diagnosis Date Noted  . Dementia 02/07/2014  . Tobacco use 02/07/2014  . Anxiety and depression 09/22/2012  . HTN (hypertension) 09/30/2011  . Hypercholesterolemia 09/30/2011     Prior to Admission medications     Medication Sig Start Date End Date Taking? Authorizing Provider  ALPRAZolam Prudy Feeler) 0.25 MG tablet TAKE 1 TABLET EVERY DAY AS NEEDED FOR ANXIETY 09/05/15  Yes Esli Clements, PA-C  donepezil (ARICEPT) 10 MG tablet Take 1 tablet (10 mg total) by mouth at bedtime. 07/29/15  Yes Huston Foley, MD  ferrous sulfate 325 (65 FE) MG tablet Take 325 mg by mouth daily with breakfast.   Yes Historical Provider, MD  fexofenadine (ALLEGRA) 180 MG tablet Take 180 mg by mouth daily.   Yes Historical Provider, MD  fish oil-omega-3 fatty acids 1000 MG capsule Take 2 g by mouth daily.   Yes Historical Provider, MD  lisinopril-hydrochlorothiazide (PRINZIDE,ZESTORETIC) 20-25 MG per tablet TAKE 1 TABLET BY MOUTH DAILY. 11/15/14  Yes Obaloluwa Delatte, PA-C  Multiple Vitamin (MULTIVITAMIN) tablet Take 1 tablet by mouth daily.   Yes Historical Provider, MD  OVER THE COUNTER MEDICATION Magnesium    Yes Historical Provider, MD  OVER THE COUNTER MEDICATION Vit D-3 1000iu   Yes Historical Provider, MD  OVER THE COUNTER MEDICATION Aloe Vera juice once daily   Yes Historical Provider, MD  PARoxetine (PAXIL) 30 MG tablet TAKE 1 TABLET (30 MG TOTAL) BY MOUTH DAILY. 11/15/14  Yes Sharelle Burditt, PA-C  simvastatin (ZOCOR) 20 MG tablet TAKE 1 TABLET BY MOUTH EVERY DAY 06/04/15  Yes Tori Dattilio, PA-C  cetirizine (ZYRTEC) 10 MG tablet Take 10 mg by mouth daily. Reported on 09/23/2015    Historical Provider, MD     Allergies  Allergen Reactions  . Codeine     Excessive sweating       Objective:  Physical Exam  Constitutional: She is oriented to person, place, and time. She appears well-developed and well-nourished. She is active and cooperative. No distress.  BP 114/70 mmHg  Pulse 91  Temp(Src) 98.2 F (36.8 C) (Oral)  Resp 18  Ht 5\' 4"  (1.626 m)  Wt 127 lb 6.4 oz (57.788 kg)  BMI 21.86 kg/m2  SpO2 98%  LMP 06/01/2006  HENT:  Head: Normocephalic and atraumatic.  Right Ear: Hearing normal.  Left Ear: Hearing normal.   Eyes: Conjunctivae are normal. No scleral icterus.  Neck: Normal range of motion. Neck supple. No thyromegaly present.  Cardiovascular: Normal rate, regular rhythm and normal heart sounds.   Pulses:      Radial pulses are 2+ on the right side, and 2+ on the left side.  Pulmonary/Chest: Effort normal and breath sounds normal.  Lymphadenopathy:       Head (right side): No tonsillar, no preauricular, no posterior auricular and no occipital adenopathy present.       Head (left side): No tonsillar, no preauricular, no posterior auricular and no occipital adenopathy present.    She has no cervical adenopathy.       Right: No supraclavicular adenopathy present.       Left: No supraclavicular adenopathy present.  Neurological: She is alert and oriented to person, place, and time. No sensory deficit.  Skin: Skin is warm, dry and intact. No rash noted. No cyanosis or erythema. Nails show no clubbing.  Psychiatric: She has a normal mood and affect. Her speech is normal and behavior is normal. Cognition and memory are impaired (mild confusion, but oriented and appropriately interactive/communicative).           Assessment & Plan:   1. Essential hypertension Controlled. Stable. COntinue. - lisinopril-hydrochlorothiazide (PRINZIDE,ZESTORETIC) 20-25 MG tablet; Take 1 tablet by mouth daily.  Dispense: 90 tablet; Refill: 3 - CBC with Differential/Platelet - TSH - Comprehensive metabolic panel  2. Hypercholesterolemia Await lab results. - simvastatin (ZOCOR) 20 MG tablet; TAKE 1 TABLET BY MOUTH EVERY DAY  Dispense: 90 tablet; Refill: 3 - Lipid panel  3. Tobacco use Encouraged smoking cessation.  4. Dementia, without behavioral disturbance Stable/improved per last neurology note. Follow-up with Dr. Frances FurbishAthar per her instructions.  5. Anxiety and depression Stable/controlled. - PARoxetine (PAXIL) 30 MG tablet; TAKE 1 TABLET (30 MG TOTAL) BY MOUTH DAILY.  Dispense: 90 tablet; Refill: 3 -  ALPRAZolam (XANAX) 0.25 MG tablet; TAKE 1 TABLET EVERY DAY AS NEEDED FOR ANXIETY  Dispense: 30 tablet; Refill: 0 - ALPRAZolam (XANAX) 0.25 MG tablet; TAKE 1 TABLET EVERY DAY AS NEEDED FOR ANXIETY  Dispense: 30 tablet; Refill: 0 - ALPRAZolam (XANAX) 0.25 MG tablet; TAKE 1 TABLET EVERY DAY AS NEEDED FOR ANXIETY  Dispense: 30 tablet; Refill: 0  6. Need for shingles vaccine Take to the pharmacy for administration. If not covered by her insurance plan, ok to wait until age 60. - zoster vaccine live, PF, (ZOSTAVAX) [id number] UNT/0.65ML injection; Inject 19,400 Units into the skin once.  Dispense: 0.65 mL; Refill: 0   Fernande Brashelle S. Dezhane Staten, PA-C Physician Assistant-Certified [facility]

## 2015-10-10 ENCOUNTER — Other Ambulatory Visit: Payer: Self-pay | Admitting: Physician Assistant

## 2015-10-11 NOTE — Telephone Encounter (Signed)
Meds ordered this encounter  Medications  . ALPRAZolam (XANAX) 0.25 MG tablet    Sig: TAKE 1 TABLET EVERY DAY AS NEEDED FOR ANXIETY    Dispense:  30 tablet    Refill:  0    Not to exceed 5 additional fills before 03/03/2016

## 2015-10-14 NOTE — Telephone Encounter (Signed)
Faxed

## 2015-12-09 ENCOUNTER — Other Ambulatory Visit: Payer: Self-pay | Admitting: Internal Medicine

## 2016-01-07 ENCOUNTER — Other Ambulatory Visit: Payer: Self-pay | Admitting: Physician Assistant

## 2016-01-09 NOTE — Telephone Encounter (Signed)
Faxed

## 2016-01-09 NOTE — Telephone Encounter (Signed)
Done

## 2016-01-27 ENCOUNTER — Ambulatory Visit (INDEPENDENT_AMBULATORY_CARE_PROVIDER_SITE_OTHER): Payer: PRIVATE HEALTH INSURANCE | Admitting: Neurology

## 2016-01-27 ENCOUNTER — Encounter: Payer: Self-pay | Admitting: Neurology

## 2016-01-27 VITALS — BP 110/62 | HR 78 | Ht 64.0 in | Wt 129.0 lb

## 2016-01-27 DIAGNOSIS — F0390 Unspecified dementia without behavioral disturbance: Secondary | ICD-10-CM

## 2016-01-27 NOTE — Progress Notes (Signed)
Subjective:    Patient ID: Lindsay Collier is a 60 y.o. female.  HPI     Interim history:   [name] is a 60 year-old right-handed woman with an underlying medical history of hyperlipidemia, hypertension, anxiety, depression, and smoking, who presents for follow-up consultation of her memory loss. The patient is accompanied by her husband again today. I last saw her on 07/29/15, at which time she reported doing well, no new issues, was still smoking about half pack per day, was trying to drink more water, less sodas. Memory scores were slightly better last time than the time before. I suggested we continue with donepezil 10 mg daily.   Today, 01/27/2016: She reports no new issues, still smokes about half a pack per day, drinks sodas about 2-4 glasses per day, some water, still works, still drives, no recent issues driving, no confusion, no disorientationStill smok. She reports no issues with depression or anxiety or sleep. Her husband has no additional concerns.   Previously:  I saw her on 01/14/2015, at which time she reported doing well. She was trying to drink enough water. She was still drinking some soda. She was active at work, on her feet a lot. She was driving about 20 minutes to work and from work. Her husband had observed her driving and had no new concerns. She was able to tolerate Aricept at 10 mg daily. Her mood was better. She felt that the Paxil had helped. Unfortunately, she was still smoking. Her MMSE was 21/30, CDT was 1/4, and AFT was 8/min at the time.     I saw her on 08/13/2014, at which time her husband reported that she was able to tolerate Aricept 5 mg daily. She had a recheck on her thyroid function through her primary care physician in September 2015. She was still smoking. She was still drinking a lot of sodas. She was working and driving. She did get lost one time. She felt that her memory was stable. Her MMSE was 20 out of 30, clock drawing was 1 out of 4 and animal  fluency was 7 at the time. I asked her to drink more water and stop smoking. I increased her Aricept to 10 mg. I asked her husband to observe her driving as a did not feel she was completely safe to drive. I talked to her and her husband extensively about her driving last time.   I first met her on 01/15/2014 at the request of her primary care provider, at which time her husband reported a several year history of memory loss including forgetfulness, easy distractibility, difficulty focusing and following instructions and difficulty following through at work. He felt that her memory loss started about 2-3 years ago, with gradual progression. At the time of her first visit I suggested we proceed with an MRI brain and formal cognitive testing and we also did some blood work. Her TSH was borderline low at 0.395. RPR was negative, vitamin B12 was elevated at 1600. We called and talked to her husband about her blood test results and advised him to pursue repeat thyroid function testing through her primary care physician. Her MMSE at that time was 18, clock drawing was 2 out of 4, animal fluency was 11. She was advised to increase her water intake, reduce her soda intake, and stop smoking. I did not start her on any new medication at the time. She had a brain MRI without contrast on 02/25/2014: Abnormal MRI brain showing  mild changes  of chronic microvascular ischemia and  age disproportion generalized cerebral atrophy which appeared to have progressed compared with previous scan from 2012. In addition, personally reviewed the images through the PACS system. I also talked to her husband on the phone on 03/12/2014 about her MRI results and suggested we go ahead and start her on Aricept 5 mg strength. I talked to him about potential side effects. She was seen for a diagnostic interview by Dr. Valentina Collier on 05/07/2014 and I reviewed the report. Unfortunately, they did not pursue neuropsychological testing secondary to the cost  factor as I understand.   She drives, and has not gotten lost, no issues dressing, cooking.   Symptom onset was gradual and are now gradually worsening. She primarily has been having difficulty with short-term memory such as forgetfulness, misplacing things, asking the same question again and forgetting dates and events. Familiar faces are easily recognized. She had a CTH wo contrast on 10/22/10 which was reported as normal, but MRA head showed mild cerebellar atrophy.   She reports no recurrent headaches. There is family history of memory loss, perhaps, in her younger brother and maybe her parents, but she is not sure.    There is no report of Auditory Hallucinations and Visual Hallucinations and there are no delusions, such as paranoia.   She has not been on any dementia medications. She had some residual symptoms of depression and anxiety and had an increase in her Paxil to 30 mg. She denies suicidal ideations or homicidal ideations.   The patient denies prior TIA or stroke symptoms, such as sudden onset of one sided weakness, numbness, tingling, slurring of speech or droopy face, hearing loss, tinnitus, diplopia or visual field cut or monocular loss of vision, and denies recurrent headaches.   Of note, the patient reports snoring, and there is no report of witnessed apneas or choking sensations while asleep. She drinks more than 3 Pepsis per day. She smokes about 1/2 ppd.   Her Past Medical History Is Significant For: Past Medical History:  Diagnosis Date  . Anemia   . Anxiety   . Hyperlipidemia   . Hypertension   . Smoker     Her Past Surgical History Is Significant For: Past Surgical History:  Procedure Laterality Date  . ABDOMINAL HYSTERECTOMY     total  . CESAREAN SECTION    . COLONOSCOPY     2012  . TONSILLECTOMY AND ADENOIDECTOMY      Her Family History Is Significant For: Family History  Problem Relation Age of Onset  . Hypertension Father   . Heart disease Father   .  Cancer Father     Prostate  . Hypertension Mother   . Asthma Son   . Diabetes Brother     Her Social History Is Significant For: Social History   Social History  . Marital status: Married    Spouse name: Coralyn Mark  . Number of children: 1  . Years of education: 12+   Occupational History  . [employer]  . DIETARY AIDE [employer]   Social History Main Topics  . Smoking status: Current Every Day Smoker    Types: Cigarettes  . Smokeless tobacco: Never Used     Comment: cutting back,currently smoking 1/2 pk daily  . Alcohol use No  . Drug use: No  . Sexual activity: Yes    Partners: Male    Birth control/ protection: Surgical   Other Topics Concern  .  None   Social History Narrative   Lives with her husband and their son. Patient is right handed    Her Allergies Are:  Allergies  Allergen Reactions  . Codeine     Excessive sweating  :   Her Current Medications Are:  Outpatient Encounter Prescriptions as of 01/27/2016  Medication Sig  . ALPRAZolam (XANAX) 0.25 MG tablet TAKE 1 TABLET EVERY DAY AS NEEDED FOR ANXIETY  . donepezil (ARICEPT) 10 MG tablet Take 1 tablet (10 mg total) by mouth at bedtime.  . ferrous sulfate 325 (65 FE) MG tablet Take 325 mg by mouth daily with breakfast.  . fexofenadine (ALLEGRA) 180 MG tablet Take 180 mg by mouth daily.  . fish oil-omega-3 fatty acids 1000 MG capsule Take 2 g by mouth daily.  Marland Kitchen lisinopril-hydrochlorothiazide (PRINZIDE,ZESTORETIC) 20-25 MG tablet Take 1 tablet by mouth daily.  . Multiple Vitamin (MULTIVITAMIN) tablet Take 1 tablet by mouth daily.  Marland Kitchen OVER THE COUNTER MEDICATION Magnesium 272m  . OVER THE COUNTER MEDICATION Vit D-3 1000iu  . OVER THE COUNTER MEDICATION Aloe Vera juice once daily  . PARoxetine (PAXIL) 30 MG tablet TAKE 1 TABLET (30 MG TOTAL) BY MOUTH DAILY.  . simvastatin (ZOCOR) 20 MG tablet TAKE 1 TABLET BY MOUTH EVERY DAY  . zoster vaccine live, PF, (ZOSTAVAX) [id number] UNT/0.65ML injection Inject 19,400 Units into the skin once.  . [DISCONTINUED] ALPRAZolam (XANAX) 0.25 MG tablet TAKE 1 TABLET EVERY DAY AS NEEDED FOR ANXIETY  . [DISCONTINUED] ALPRAZolam (XANAX) 0.25 MG tablet TAKE 1 TABLET EVERY DAY AS NEEDED FOR ANXIETY  . [DISCONTINUED] ALPRAZolam (XANAX) 0.25 MG tablet TAKE 1 TABLET BY MOUTH EVERY DAY AS NEEDED FOR ANXIETY  . [DISCONTINUED] cetirizine (ZYRTEC) 10 MG tablet Take 10 mg by mouth daily. Reported on 09/23/2015   No facility-administered encounter medications on file as of 01/27/2016.   :  Review of Systems:  Out of a complete 14 point review of systems, all are reviewed and negative with the exception of these symptoms as listed below: Review of Systems  Neurological:       Patient is here for f/u. No new concerns per patient.     Objective:  Neurologic Exam  Physical Exam Physical Examination:   Vitals:   01/27/16 1040  BP: 110/62  Pulse: 78   General Examination: The patient is a very pleasant 60yo female in no acute distress. She is calm and cooperative with the exam, very mildly anxious today. She is in good spirits today.    HEENT: Normocephalic, atraumatic, pupils are equal, round and reactive to light and accommodation. Funduscopic exam is normal with sharp disc margins noted. Mild b/l cataracts. Extraocular tracking shows no saccadic breakdown without nystagmus noted. Hearing is intact. Face is symmetric with no facial masking and normal facial sensation. There is no lip, neck or jaw tremor. Neck is not rigid with intact passive ROM. There are no carotid bruits on auscultation. Oropharynx exam reveals mild mouth dryness. Mild airway crowding is noted. Mallampati is class II. Tongue protrudes centrally and palate elevates symmetrically.    Chest: is clear to auscultation without wheezing, rhonchi or crackles noted.  Heart: sounds are regular and normal without murmurs, rubs or gallops noted.   Abdomen: is soft, non-tender and  non-distended with normal bowel sounds appreciated on auscultation.  Extremities: There is no pitting edema in the distal lower extremities bilaterally. Pedal pulses are intact.   Skin: is warm and dry with no trophic changes noted. Age-related changes  are noted on the skin.   Musculoskeletal: exam reveals no obvious joint deformities, tenderness or joint swelling or erythema.   Neurologically:  Mental status: The patient is awake and alert, paying good  attention. She is able to partially provide the history. Her husband provides details. She is oriented to: person, place, time/date, situation, day of week, month of year and year. Her memory, attention, language and knowledge are impaired. There is no aphasia, agnosia, apraxia or anomia. There is a mild degree of bradyphrenia. Speech is not hypophonic with no dysarthria noted. Mood is congruent and affect is good today.    On 01/27/2016: MMSE: 24/30, CDT: 1/4, AFT: 16/min.  On 07/29/2015: MMSE: 27/30, CDT: 1/4, AFT: 17/min.  On 01/14/2015: MMSE 21/30, CDT: 1/4, AFT is 8/min.  On 08/13/2014: MMSE 20/30,  clock drawing is 1 out of 4 and animal fluency is 7.   On 01/15/14: Her MMSE (Mini-Mental state exam) score is 18/30. CDT (Clock Drawing Test) score is 2/4. AFT (Animal Fluency Test) score is 11.   Cranial nerves are as described above under HEENT exam. In addition, shoulder shrug is normal with equal shoulder height noted.  Motor exam: Normal bulk, and strength for age is noted. Tone is not rigid with absence of cogwheeling in the extremities. There is overall no bradykinesia. There is no drift or rebound. There is no resting tremor. There is a mild b/l UE postural and action tremor, stable and unchanged.  Romberg is negative. Reflexes are 2+ in the upper extremities and the lower extremities. Fine motor skills: are unremarkable with finger to nose testing and hand movements.   Cerebellar testing shows no dysmetria or intention tremor on finger  to nose testing. Heel to shin is unremarkable. There is no truncal or gait ataxia.   Sensory exam is intact to light touch, pinprick, vibration, temperature sense in the upper and lower extremities.   Gait, station and balance: She stands up from the seated position with no difficulty and needs no help. No veering to one side is noted. No leaning to one side. Posture is not stooped. Stance is narrow-based. Balance is not significantly impaired overall. Tandem walk is good today.  Assessment and Plan:  In summary, Zasha Belleau Collier is a 60 year old female with an underlying medical history of hyperlipidemia, hypertension, anxiety, depression, and smoking, who presents for follow-up consultation of her dementia without behavioral disturbance. She has RF for vascular dementia. Work up included brain MRI and blood work. Prior referral to neuro psychology was not successful (financially).  Memory scores have fluctuated, improved on medication. I would like to keep her donepezil 10 mg daily. We talked about her driving again today, her husband has previously observed her driving and feels that she is okay to drive to and from work. It takes her about 20 minutes one way. 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 She is active and on her feet quite a bit at work. She Is advised to drink more water, less sodas, again advised to stop smoking completely. At this juncture, I would like to proceed with formal memory testing with a neuropsychologist, I made a referral in that regard, she is in agreement. I will see her back in 6 months, sooner as needed, she did not need a refill on her generic Aricept today. They are encouraged to call with any interim questions or concerns or refill requests. I answered all their questions today and the patient and  her husband were in agreement. I spent 25 minutes in total face-to-face time with the patient, more than 50% of which was spent in counseling and  coordination of care, reviewing test results, reviewing medication and discussing or reviewing the diagnosis of dementia, its prognosis and treatment options as well as her test results and her driving.

## 2016-01-27 NOTE — Patient Instructions (Addendum)
We will request a formal neuropsychological test (aka cognitive testing) for your memory complaints. This requires a referral to a trained and licensed neuropsychologist and will be a separate appointment at a different clinic.   We will continue your memory medication.   Please stop smoking.

## 2016-02-05 ENCOUNTER — Other Ambulatory Visit: Payer: Self-pay | Admitting: Physician Assistant

## 2016-02-05 DIAGNOSIS — F329 Major depressive disorder, single episode, unspecified: Secondary | ICD-10-CM

## 2016-02-05 DIAGNOSIS — F32A Depression, unspecified: Secondary | ICD-10-CM

## 2016-02-05 DIAGNOSIS — F419 Anxiety disorder, unspecified: Principal | ICD-10-CM

## 2016-02-05 NOTE — Telephone Encounter (Signed)
Meds ordered this encounter  Medications  . ALPRAZolam (XANAX) 0.25 MG tablet    Sig: TAKE 1 TABLET EVERY DAY AS NEEDED FOR ANXIETY    Dispense:  30 tablet    Refill:  0    Not to exceed 5 additional fills before 07/07/2016

## 2016-02-06 ENCOUNTER — Other Ambulatory Visit: Payer: Self-pay | Admitting: Physician Assistant

## 2016-02-06 DIAGNOSIS — Z1231 Encounter for screening mammogram for malignant neoplasm of breast: Secondary | ICD-10-CM

## 2016-02-06 NOTE — Telephone Encounter (Signed)
Faxed

## 2016-02-24 ENCOUNTER — Ambulatory Visit
Admission: RE | Admit: 2016-02-24 | Discharge: 2016-02-24 | Disposition: A | Discharge status: Home / Self Care | Payer: PRIVATE HEALTH INSURANCE | Source: Ambulatory Visit | Attending: Physician Assistant | Admitting: Physician Assistant

## 2016-02-24 DIAGNOSIS — Z1231 Encounter for screening mammogram for malignant neoplasm of breast: Secondary | ICD-10-CM

## 2016-03-06 ENCOUNTER — Telehealth: Payer: Self-pay

## 2016-03-06 ENCOUNTER — Other Ambulatory Visit: Payer: Self-pay | Admitting: Physician Assistant

## 2016-03-06 DIAGNOSIS — F419 Anxiety disorder, unspecified: Principal | ICD-10-CM

## 2016-03-06 DIAGNOSIS — F32A Depression, unspecified: Secondary | ICD-10-CM

## 2016-03-06 DIAGNOSIS — F329 Major depressive disorder, single episode, unspecified: Secondary | ICD-10-CM

## 2016-03-06 NOTE — Telephone Encounter (Signed)
Called pt and let her know that her prescriptions were ready to be picked.

## 2016-03-06 NOTE — Telephone Encounter (Signed)
Last OV w/ Chelle, anxiety discussed. Last RF 9/6

## 2016-03-06 NOTE — Telephone Encounter (Signed)
Meds ordered this encounter  Medications  . ALPRAZolam (XANAX) 0.25 MG tablet    Sig: TAKE 1 TABLET AT BEDTIME AS NEEDED FOR ANXIETY    Dispense:  30 tablet    Refill:  0    Not to exceed 5 additional fills before 08/04/2016

## 2016-03-08 ENCOUNTER — Other Ambulatory Visit: Payer: Self-pay | Admitting: Physician Assistant

## 2016-03-08 DIAGNOSIS — F419 Anxiety disorder, unspecified: Principal | ICD-10-CM

## 2016-03-08 DIAGNOSIS — F32A Depression, unspecified: Secondary | ICD-10-CM

## 2016-03-08 DIAGNOSIS — F329 Major depressive disorder, single episode, unspecified: Secondary | ICD-10-CM

## 2016-03-09 NOTE — Telephone Encounter (Signed)
Called in.

## 2016-04-06 ENCOUNTER — Ambulatory Visit (INDEPENDENT_AMBULATORY_CARE_PROVIDER_SITE_OTHER): Payer: PRIVATE HEALTH INSURANCE | Admitting: Physician Assistant

## 2016-04-06 VITALS — BP 112/60 | HR 63 | Temp 97.7°F | Resp 16 | Ht 63.0 in | Wt 123.4 lb

## 2016-04-06 DIAGNOSIS — Z23 Encounter for immunization: Secondary | ICD-10-CM | POA: Diagnosis not present

## 2016-04-06 DIAGNOSIS — E7800 Pure hypercholesterolemia, unspecified: Secondary | ICD-10-CM | POA: Diagnosis not present

## 2016-04-06 DIAGNOSIS — F419 Anxiety disorder, unspecified: Principal | ICD-10-CM

## 2016-04-06 DIAGNOSIS — I10 Essential (primary) hypertension: Secondary | ICD-10-CM | POA: Diagnosis not present

## 2016-04-06 DIAGNOSIS — F0390 Unspecified dementia without behavioral disturbance: Secondary | ICD-10-CM

## 2016-04-06 DIAGNOSIS — Z720 Tobacco use: Secondary | ICD-10-CM

## 2016-04-06 DIAGNOSIS — Z1211 Encounter for screening for malignant neoplasm of colon: Secondary | ICD-10-CM | POA: Diagnosis not present

## 2016-04-06 DIAGNOSIS — F32A Depression, unspecified: Secondary | ICD-10-CM

## 2016-04-06 DIAGNOSIS — F418 Other specified anxiety disorders: Secondary | ICD-10-CM | POA: Diagnosis not present

## 2016-04-06 DIAGNOSIS — F329 Major depressive disorder, single episode, unspecified: Secondary | ICD-10-CM

## 2016-04-06 LAB — LIPID PANEL
CHOLESTEROL: 186 mg/dL (ref <200)
HDL: 42 mg/dL — AB (ref >50)
LDL Cholesterol: 108 mg/dL — ABNORMAL HIGH
TRIGLYCERIDES: 182 mg/dL — AB (ref <150)
Total CHOL/HDL Ratio: 4.4 Ratio (ref <5.0)
VLDL: 36 mg/dL — AB (ref <30)

## 2016-04-06 LAB — COMPREHENSIVE METABOLIC PANEL
ALBUMIN: 3.9 g/dL (ref 3.6–5.1)
ALT: 11 U/L (ref 6–29)
AST: 20 U/L (ref 10–35)
Alkaline Phosphatase: 68 U/L (ref 33–130)
BUN: 7 mg/dL (ref 7–25)
CALCIUM: 9.6 mg/dL (ref 8.6–10.4)
CHLORIDE: 106 mmol/L (ref 98–110)
CO2: 26 mmol/L (ref 20–31)
Creat: 0.78 mg/dL (ref 0.50–0.99)
Glucose, Bld: 85 mg/dL (ref 65–99)
POTASSIUM: 4.1 mmol/L (ref 3.5–5.3)
Sodium: 143 mmol/L (ref 135–146)
TOTAL PROTEIN: 6.5 g/dL (ref 6.1–8.1)
Total Bilirubin: 0.2 mg/dL (ref 0.2–1.2)

## 2016-04-06 MED ORDER — ZOSTER VACCINE LIVE 19400 UNT/0.65ML ~~LOC~~ SUSR: 0.6500 mL | Freq: Once | SUBCUTANEOUS | 0 refills | Status: AC

## 2016-04-06 MED ORDER — ALPRAZOLAM 0.25 MG PO TABS: 0.2500 mg | ORAL_TABLET | Freq: Two times a day (BID) | ORAL | 0 refills | Status: DC | PRN

## 2016-04-06 NOTE — Progress Notes (Signed)
Patient ID: Lindsay Collier, female    DOB: [birth year], 60 y.o.   MRN: [id number]  PCP: Porfirio Oarhelle Cindee Mclester, PA-C  Subjective:   Chief Complaint  Patient presents with  . Anxiety    x5days     HPI Presents for evaluation of increased anxiety x 5 days. She is accompanied by her husband, as usual.  Her anxiety has increased recently. Her mother recently died, and the memorial service was 10/30. When she went back to work on 03/31/2016, was sent home before she even clocked in. She had already been off for about a week, and she isn't sure why she was sent home, with written instructions from her supervisor to return 04/09/16. Her husband contacted the supervisor, but he hasn't received a return call.  She is described as being tremulous. Even voice was "trembly." Hands and feet were cold and sweaty. Poor focus/concentration. "She might say something and mean something else." Intermittent ringing in the ears. Poor appetite. Sleeping well.  "I'm not sure what happened. I haven't seen her frenzied like this in a while." Husband increased the alprazolam to BID, with good results.   Review of Systems As above. No CP, SOB, HA, dizziness. No nausea/vomiting/diarrhea. No muscle or joint pain or swelling. No urinary symptoms.     Patient Active Problem List   Diagnosis Date Noted  . Dementia 02/07/2014  . Tobacco use 02/07/2014  . Anxiety and depression 09/22/2012  . HTN (hypertension) 09/30/2011  . Hypercholesterolemia 09/30/2011     Prior to Admission medications   Medication Sig Start Date End Date Taking? Authorizing Provider  ALPRAZolam Prudy Feeler(XANAX) 0.25 MG tablet TAKE 1 TABLET AT BEDTIME AS NEEDED FOR ANXIETY 03/06/16  Yes Ogden Handlin, PA-C  donepezil (ARICEPT) 10 MG tablet Take 1 tablet (10 mg total) by mouth at bedtime. 07/29/15  Yes Huston FoleySaima Athar, MD  ferrous sulfate 325 (65 FE) MG tablet Take 325 mg by mouth daily with breakfast.   Yes Historical Provider, MD  fish  oil-omega-3 fatty acids 1000 MG capsule Take 2 g by mouth daily.   Yes Historical Provider, MD  lisinopril-hydrochlorothiazide (PRINZIDE,ZESTORETIC) 20-25 MG tablet Take 1 tablet by mouth daily. 09/23/15  Yes Kataleia Quaranta, PA-C  Multiple Vitamin (MULTIVITAMIN) tablet Take 1 tablet by mouth daily.   Yes Historical Provider, MD  OVER THE COUNTER MEDICATION Magnesium 250mg    Yes Historical Provider, MD  OVER THE COUNTER MEDICATION Vit D-3 1000iu   Yes Historical Provider, MD  OVER THE COUNTER MEDICATION Aloe Vera juice once daily   Yes Historical Provider, MD  PARoxetine (PAXIL) 30 MG tablet TAKE 1 TABLET (30 MG TOTAL) BY MOUTH DAILY. 09/23/15  Yes Elian Gloster, PA-C  simvastatin (ZOCOR) 20 MG tablet TAKE 1 TABLET BY MOUTH EVERY DAY 09/23/15  Yes Auriel Kist, PA-C  fexofenadine (ALLEGRA) 180 MG tablet Take 180 mg by mouth daily.    Historical Provider, MD  zoster vaccine live, PF, (ZOSTAVAX) [id number] UNT/0.65ML injection Inject 19,400 Units into the skin once. Patient not taking: Reported on 04/06/2016 09/23/15   Porfirio Oarhelle Delfin Squillace, PA-C     Allergies  Allergen Reactions  . Codeine     Excessive sweating       Objective:  Physical Exam  Constitutional: She is oriented to person, place, and time. She appears well-developed and well-nourished. She is active and cooperative. No distress.  BP 112/60   Pulse 63   Temp 97.7 F (36.5 C) (Oral)   Resp 16   Ht 5\' 3"  (1.6 m)  Wt 123 lb 6.4 oz (56 kg)   LMP 06/01/2006   SpO2 97%   BMI 21.86 kg/m   HENT:  Head: Normocephalic and atraumatic.  Right Ear: Hearing normal.  Left Ear: Hearing normal.  Eyes: Conjunctivae are normal. No scleral icterus.  Neck: Normal range of motion. Neck supple. No thyromegaly present.  Cardiovascular: Normal rate, regular rhythm and normal heart sounds.   Pulses:      Radial pulses are 2+ on the right side, and 2+ on the left side.  Pulmonary/Chest: Effort normal and breath sounds normal.  Lymphadenopathy:        Head (right side): No tonsillar, no preauricular, no posterior auricular and no occipital adenopathy present.       Head (left side): No tonsillar, no preauricular, no posterior auricular and no occipital adenopathy present.    She has no cervical adenopathy.       Right: No supraclavicular adenopathy present.       Left: No supraclavicular adenopathy present.  Neurological: She is alert and oriented to person, place, and time. No sensory deficit.  No tremor noted.  Skin: Skin is warm, dry and intact. No rash noted. No cyanosis or erythema. Nails show no clubbing.  Psychiatric: She has a normal mood and affect. Her speech is normal and behavior is normal. Thought content is not paranoid and not delusional. Cognition and memory are impaired. She does not express impulsivity or inappropriate judgment. She expresses no homicidal and no suicidal ideation. She exhibits abnormal recent memory.  Cheerful. Jumps up to hug me when I entered the room.           Assessment & Plan:   1. Anxiety and depression Increased, unclear cause. Possibly her mother's death, but symptoms didn't worsen untl she returned to work [facility] [employer] [facility]. Increase alprazolam to BID PRN.  - ALPRAZolam (XANAX) 0.25 MG tablet; Take 1 tablet (0.25 mg total) by mouth 2 (two) times daily as needed for anxiety.  Dispense: 60 tablet; Refill: 0  2. Dementia without behavioral disturbance, unspecified dementia type Stable appearing, though decline may escalate her anxiety. Wonder if her recent stress related to her mother's death may have at least temporarily caused cognitive decline such that her anxiety worsened.  3. Tobacco use Not really interested in cessation.  4. Essential hypertension Controlled. - Comprehensive metabolic panel  5. Hypercholesterolemia Await labs - Comprehensive metabolic panel - Lipid panel  6. Need for influenza vaccination - Flu Vaccine QUAD 36+ mos IM  7. Screening for colon  cancer Last colonoscopy was 2007 with Dr. Evette CristalGanem. - Ambulatory referral to Gastroenterology  8. Need for shingles vaccine - Zoster Vaccine Live, PF, (ZOSTAVAX) [id number] UNT/0.65ML injection; Inject 19,400 Units into the skin once.  Dispense: 0.65 mL; Refill: 0   Return in about 5 months (around 09/04/2016).   Fernande Brashelle S. Keiana Tavella, PA-C Physician Assistant-Certified [facility]

## 2016-04-06 NOTE — Patient Instructions (Signed)
     IF you received an x-ray today, you will receive an invoice from [facility]. Please contact [facility] at [phone] with questions or concerns regarding your invoice.   IF you received labwork today, you will receive an invoice from [facility]. Please contact [facility] at [phone] with questions or concerns regarding your invoice.   Our billing staff will not be able to assist you with questions regarding bills from these companies.  You will be contacted with the lab results as soon as they are available. The fastest way to get your results is to activate your My Chart account. Instructions are located on the last page of this paperwork. If you have not heard from us regarding the results in 2 weeks, please contact this office.      

## 2016-04-07 ENCOUNTER — Telehealth: Payer: Self-pay

## 2016-04-07 ENCOUNTER — Encounter: Payer: Self-pay | Admitting: Physician Assistant

## 2016-04-07 NOTE — Telephone Encounter (Signed)
Lindsay Collier   Patient had a colonoscopy 12/15/2010 - not due until 2022 - unless there is an issue. [name] is faxing over the report from July 2012

## 2016-04-14 ENCOUNTER — Encounter: Payer: Self-pay | Admitting: Physician Assistant

## 2016-05-04 ENCOUNTER — Other Ambulatory Visit: Payer: Self-pay | Admitting: Physician Assistant

## 2016-05-04 DIAGNOSIS — F329 Major depressive disorder, single episode, unspecified: Secondary | ICD-10-CM

## 2016-05-04 DIAGNOSIS — F32A Depression, unspecified: Secondary | ICD-10-CM

## 2016-05-04 DIAGNOSIS — F419 Anxiety disorder, unspecified: Principal | ICD-10-CM

## 2016-05-07 ENCOUNTER — Telehealth: Payer: Self-pay

## 2016-05-07 ENCOUNTER — Telehealth: Payer: Self-pay | Admitting: Family Medicine

## 2016-05-07 NOTE — Telephone Encounter (Signed)
Pt husband has called for a refill on pt Xanax states that [facility] has sent a request in last week and they haven't heard anything please respond at [phone] she is out of medicine

## 2016-05-07 NOTE — Telephone Encounter (Signed)
Received a form from pt asking that Dr. Frances FurbishAthar fill out the [employer] and Short Term Disability forms.  I called pt, spoke to pt's husband, Aurther Lofterry, per [facility]. I asked him what the pt is asking for with regards to the [facility] paperwork and short term disability. Pt's husband says that she wants to go out on disability because of her dementia and does not want to work even part time.  I spoke to Dr. Frances FurbishAthar. Dr. Frances FurbishAthar wants pt to complete neuro pscyh testing before we fill out paperwork. Pt has already been referred to Dr. Jacquelyne BalintMcDermott for neuro psych testing. It is unclear if pt has actually undergone this testing.  I called Dr. McDermott's office. They have tried to contact pt 3 times by phone and 1 time by mail to schedule pt but she has never rescheduled. Dr. Jacquelyne BalintMcDermott is booked until March, but they do not need a new referral at this time to get her scheduled.   I called pt's husband again, Aurther Lofterry, per [facility]. I advised him that Dr. Frances FurbishAthar cannot fill out the FMLA/disability paperwork until pt has undergone nuropscyh testing. Pt's husband says that he is worried about if insurance will cover that testing. I offered to give him the number to Dr. McDermott's office so he can discuss scheduling and cost with them, but he declined. Pt says that he will pick up the paperwork from our office and bring it to pt's PCP's office to fill out. I asked him to call us back with any further questions or concerns. Pt's husband verbalized understanding.

## 2016-05-08 NOTE — Telephone Encounter (Signed)
Meds ordered this encounter  Medications  . ALPRAZolam (XANAX) 0.25 MG tablet    Sig: TAKE 1 TABLET TWICE A DAY AS NEEDED FOR ANXIETY    Dispense:  60 tablet    Refill:  0    Not to exceed 5 additional fills before 10/03/2016

## 2016-05-08 NOTE — Telephone Encounter (Signed)
Faxed

## 2016-05-11 ENCOUNTER — Telehealth: Payer: Self-pay

## 2016-05-11 NOTE — Telephone Encounter (Signed)
Patients husband brought in some FMLA and STD forms to be completed by Chelle. I have completed what I could from the OV notes and highlighted the areas I wasn't sure about. I will place the forms in your box on 05/11/16 if you could please return them to the FMLA/Disability box at the 102 checkout desk within 5-7 business days. Thank you!

## 2016-05-13 NOTE — Telephone Encounter (Signed)
Please clarify with the patient's husband what condition the FMLA is for.

## 2016-05-13 NOTE — Telephone Encounter (Signed)
Spoke with patient's husband, Lindsay Collier. Patient has not been allowed to return to work. Last day was 04/30/2016. FMLA and STD forms completed and returned to [facility] box at front desk. Anticipate LTD forms next.

## 2016-05-13 NOTE — Telephone Encounter (Signed)
He stated he wasn't sure but he thought it was for her Dementia since her employer had put her out of work due to this issue.

## 2016-05-15 NOTE — Telephone Encounter (Signed)
Have not gotten any paperwork back for this patient, did you place it somewhere else? Or would someone else have done it for you?

## 2016-05-15 NOTE — Telephone Encounter (Signed)
No, I did it. Did I accidentally put it in the scan instead of the FMLA/disability

## 2016-05-15 NOTE — Telephone Encounter (Signed)
Paperwork scanned and faxed on 05/15/16 

## 2016-05-19 NOTE — Telephone Encounter (Signed)
Done, this was faxed 12/8

## 2016-06-02 ENCOUNTER — Other Ambulatory Visit: Payer: Self-pay | Admitting: Physician Assistant

## 2016-06-02 DIAGNOSIS — F329 Major depressive disorder, single episode, unspecified: Secondary | ICD-10-CM

## 2016-06-02 DIAGNOSIS — F32A Depression, unspecified: Secondary | ICD-10-CM

## 2016-06-02 DIAGNOSIS — F419 Anxiety disorder, unspecified: Principal | ICD-10-CM

## 2016-06-02 NOTE — Telephone Encounter (Signed)
Last seen 11/6, last RF 12/8

## 2016-06-05 DIAGNOSIS — Z0271 Encounter for disability determination: Secondary | ICD-10-CM

## 2016-06-06 ENCOUNTER — Other Ambulatory Visit: Payer: Self-pay

## 2016-06-06 NOTE — Telephone Encounter (Signed)
Pt calling about a prescrption refill for aprazolam has been calling since 1/2

## 2016-06-06 NOTE — Telephone Encounter (Signed)
It was refilled 06/02/16 but not documented called in, I called in now

## 2016-07-02 ENCOUNTER — Other Ambulatory Visit: Payer: Self-pay | Admitting: Physician Assistant

## 2016-07-02 DIAGNOSIS — F32A Depression, unspecified: Secondary | ICD-10-CM

## 2016-07-02 DIAGNOSIS — F419 Anxiety disorder, unspecified: Principal | ICD-10-CM

## 2016-07-02 DIAGNOSIS — F329 Major depressive disorder, single episode, unspecified: Secondary | ICD-10-CM

## 2016-07-02 NOTE — Telephone Encounter (Signed)
[facility]

## 2016-07-02 NOTE — Telephone Encounter (Signed)
Meds ordered this encounter  Medications  . ALPRAZolam (XANAX) 0.25 MG tablet    Sig: TAKE 1 TABLET BY MOUTH TWICE A DAY AS NEEDED FOR ANXIETY    Dispense:  60 tablet    Refill:  0    Not to exceed 5 additional fills before 12/03/2016

## 2016-07-23 ENCOUNTER — Other Ambulatory Visit: Payer: Self-pay | Admitting: Physician Assistant

## 2016-07-23 DIAGNOSIS — F329 Major depressive disorder, single episode, unspecified: Secondary | ICD-10-CM

## 2016-07-23 DIAGNOSIS — F419 Anxiety disorder, unspecified: Principal | ICD-10-CM

## 2016-07-23 DIAGNOSIS — F32A Depression, unspecified: Secondary | ICD-10-CM

## 2016-07-23 NOTE — Telephone Encounter (Signed)
[facility]

## 2016-07-23 NOTE — Telephone Encounter (Signed)
Meds ordered this encounter  Medications  . ALPRAZolam (XANAX) 0.25 MG tablet    Sig: TAKE 1 TABLET BY MOUTH TWICE A DAY AS NEEDED FOR ANXIETY    Dispense:  60 tablet    Refill:  0    Not to exceed 5 additional fills before 12/29/2016

## 2016-07-27 ENCOUNTER — Ambulatory Visit (INDEPENDENT_AMBULATORY_CARE_PROVIDER_SITE_OTHER): Payer: PRIVATE HEALTH INSURANCE | Admitting: Neurology

## 2016-07-27 ENCOUNTER — Encounter: Payer: Self-pay | Admitting: Neurology

## 2016-07-27 VITALS — BP 126/81 | HR 99 | Resp 20 | Ht 64.0 in | Wt 121.0 lb

## 2016-07-27 DIAGNOSIS — F0390 Unspecified dementia without behavioral disturbance: Secondary | ICD-10-CM | POA: Diagnosis not present

## 2016-07-27 MED ORDER — DONEPEZIL HCL 10 MG PO TABS: 10.0000 mg | ORAL_TABLET | Freq: Every day | ORAL | 12 refills | Status: DC

## 2016-07-27 NOTE — Progress Notes (Signed)
Subjective:    Patient ID: Lindsay Collier is a 61 y.o. female.  HPI     Interim history:   [name] is a 61 year old right-handed woman with an underlying medical history of hyperlipidemia, hypertension, anxiety, depression, and smoking, who presents for follow-up consultation of her memory loss. The patient is accompanied by her husband again today. I last saw her on 01/27/2016 at which time she reported no new issues, she was still working, she was still driving, she had no recent issues with driving, no episodes of confusion, no disorientation. She was still smoking, no issues with depression or anxiety, or husband reported that he had no additional concerns. Her memory scores for a little better than before. She was advised to continue with generic Aricept, stop smoking, increase her water intake, decrease her soda intake, we talked about her driving again and I asked her to proceed with formal neuropsychological testing as she had not been able to do it before.  Today, 07/27/2016 (all dictated new, as well as above notes, some dictation done in note pad or Word, outside of chart, may appear as copied):   She reports feeling stable, husband agrees. No longer working since 04/30/16 and has not driving a car since then. She had a "bad cold" a couple of weeks ago and had some weight loss, smokes 8-10 cig per day, drinks sodas about 2 to 3 cups per day, also water. She denies any depression or anxiety, her husband believes that she was not depressed or anxious, overall feels fairly stable   Previously (copied from previous notes for reference):   I saw her on 07/29/15, at which time she reported doing well, no new issues, was still smoking about half pack per day, was trying to drink more water, less sodas. Memory scores were slightly better last time than the time before. I suggested we continue with donepezil 10 mg daily.    I saw her on 01/14/2015, at which time she reported doing well. She  was trying to drink enough water. She was still drinking some soda. She was active at work, on her feet a lot. She was driving about 20 minutes to work and from work. Her husband had observed her driving and had no new concerns. She was able to tolerate Aricept at 10 mg daily. Her mood was better. She felt that the Paxil had helped. Unfortunately, she was still smoking. Her MMSE was 21/30, CDT was 1/4, and AFT was 8/min at the time.    I saw her on 08/13/2014, at which time her husband reported that she was able to tolerate Aricept 5 mg daily. She had a recheck on her thyroid function through her primary care physician in September 2015. She was still smoking. She was still drinking a lot of sodas. She was working and driving. She did get lost one time. She felt that her memory was stable. Her MMSE was 20 out of 30, clock drawing was 1 out of 4 and animal fluency was 7 at the time. I asked her to drink more water and stop smoking. I increased her Aricept to 10 mg. I asked her husband to observe her driving as a did not feel she was completely safe to drive. I talked to her and her husband extensively about her driving last time.   I first met her on 01/15/2014 at the request of her primary care provider, at which time her husband reported a several year history of memory loss including  forgetfulness, easy distractibility, difficulty focusing and following instructions and difficulty following through at work. He felt that her memory loss started about 2-3 years ago, with gradual progression. At the time of her first visit I suggested we proceed with an MRI brain and formal cognitive testing and we also did some blood work. Her TSH was borderline low at 0.395. RPR was negative, vitamin B12 was elevated at 1600. We called and talked to her husband about her blood test results and advised him to pursue repeat thyroid function testing through her primary care physician. Her MMSE at that time was 18, clock drawing  was 2 out of 4, animal fluency was 11. She was advised to increase her water intake, reduce her soda intake, and stop smoking. I did not start her on any new medication at the time. She had a brain MRI without contrast on 02/25/2014: Abnormal MRI brain showing  mild changes of chronic microvascular ischemia and  age disproportion generalized cerebral atrophy which appeared to have progressed compared with previous scan from 2012. In addition, personally reviewed the images through the PACS system. I also talked to her husband on the phone on 03/12/2014 about her MRI results and suggested we go ahead and start her on Aricept 5 mg strength. I talked to him about potential side effects. She was seen for a diagnostic interview by Dr. Valentina Shaggy on 05/07/2014 and I reviewed the report. Unfortunately, they did not pursue neuropsychological testing secondary to the cost factor as I understand.   She drives, and has not gotten lost, no issues dressing, cooking.   Symptom onset was gradual and are now gradually worsening. She primarily has been having difficulty with short-term memory such as forgetfulness, misplacing things, asking the same question again and forgetting dates and events. Familiar faces are easily recognized. She had a CTH wo contrast on 10/22/10 which was reported as normal, but MRA head showed mild cerebellar atrophy.   She reports no recurrent headaches. There is family history of memory loss, perhaps, in her younger brother and maybe her parents, but she is not sure.    There is no report of Auditory Hallucinations and Visual Hallucinations and there are no delusions, such as paranoia.   She has not been on any dementia medications. She had some residual symptoms of depression and anxiety and had an increase in her Paxil to 30 mg. She denies suicidal ideations or homicidal ideations.   The patient denies prior TIA or stroke symptoms, such as sudden onset of one sided weakness, numbness, tingling,  slurring of speech or droopy face, hearing loss, tinnitus, diplopia or visual field cut or monocular loss of vision, and denies recurrent headaches.   Of note, the patient reports snoring, and there is no report of witnessed apneas or choking sensations while asleep. She drinks more than 3 Pepsis per day. She smokes about 1/2 ppd.   Her Past Medical History Is Significant For: Past Medical History:  Diagnosis Date  . Anemia   . Anxiety   . Hyperlipidemia   . Hypertension   . Smoker     Her Past Surgical History Is Significant For: Past Surgical History:  Procedure Laterality Date  . ABDOMINAL HYSTERECTOMY     total  . CESAREAN SECTION    . COLONOSCOPY     2012  . TONSILLECTOMY AND ADENOIDECTOMY      Her Family History Is Significant For: Family History  Problem Relation Age of Onset  . Hypertension Father   .  Heart disease Father   . Cancer Father     Prostate  . Hypertension Mother   . Asthma Son   . Diabetes Brother     Her Social History Is Significant For: Social History   Social History  . Marital status: Married    Spouse name: Coralyn Mark  . Number of children: 1  . Years of education: 12+   Occupational History  . [employer]  . DIETARY AIDE [employer]   Social History Main Topics  . Smoking status: Current Every Day Smoker    Types: Cigarettes  . Smokeless tobacco: Never Used     Comment: cutting back,currently smoking 1/2 pk daily  . Alcohol use No  . Drug use: No  . Sexual activity: Yes    Partners: Male    Birth control/ protection: Surgical   Other Topics Concern  . None   Social History Narrative   Lives with her husband and their son. Patient is right handed    Her Allergies Are:  Allergies  Allergen Reactions  . Codeine     Excessive sweating  :   Her Current Medications Are:  Outpatient Encounter Prescriptions as of 07/27/2016  Medication Sig  . ALPRAZolam (XANAX) 0.25 MG tablet TAKE 1  TABLET BY MOUTH TWICE A DAY AS NEEDED FOR ANXIETY  . donepezil (ARICEPT) 10 MG tablet Take 1 tablet (10 mg total) by mouth at bedtime.  . ferrous sulfate 325 (65 FE) MG tablet Take 325 mg by mouth daily with breakfast.  . fexofenadine (ALLEGRA) 180 MG tablet Take 180 mg by mouth daily.  . fish oil-omega-3 fatty acids 1000 MG capsule Take 2 g by mouth daily.  Marland Kitchen lisinopril-hydrochlorothiazide (PRINZIDE,ZESTORETIC) 20-25 MG tablet Take 1 tablet by mouth daily.  . Multiple Vitamin (MULTIVITAMIN) tablet Take 1 tablet by mouth daily.  Marland Kitchen OVER THE COUNTER MEDICATION Magnesium 28m  . OVER THE COUNTER MEDICATION Vit D-3 1000iu  . OVER THE COUNTER MEDICATION Aloe Vera juice once daily  . PARoxetine (PAXIL) 30 MG tablet TAKE 1 TABLET (30 MG TOTAL) BY MOUTH DAILY.  . simvastatin (ZOCOR) 20 MG tablet TAKE 1 TABLET BY MOUTH EVERY DAY   No facility-administered encounter medications on file as of 07/27/2016.   :  Review of Systems:  Out of a complete 14 point review of systems, all are reviewed and negative with the exception of these symptoms as listed below:  Review of Systems  Neurological:       Pt presents today to discuss her memory. She says her memory is stable.    Objective:  Neurologic Exam  Physical Exam Physical Examination:   Vitals:   07/27/16 0833  BP: 126/81  Pulse: 99  Resp: 20   General Examination: The patient is a very pleasant 61y.o. female in no acute distress. She appears well-developed and well-nourished and well groomed. Mildly anxious, stable. He has lost a little bit of weight. In the past year, she has lost about 10 pounds.  HEENT: Normocephalic, atraumatic, pupils are equal, round and reactive to light and accommodation. Mild cataracts. Extraocular tracking is difficult for her. No nystagmus. Hearing is grossly intact. Face is symmetric with normal facial animation and normal facial sensation. Speech is clear with no dysarthria noted. There is no hypophonia. There  is a mild head and neck tremor. No significant voice tremor, no lip tremor. Neck is supple with full range of passive and active motion. There are  no carotid bruits on auscultation. Oropharynx exam reveals: mod mouth dryness, adequate dental hygiene.  Mallampati is class II. Tongue protrudes centrally and palate elevates symmetrically.   Chest: Clear to auscultation without wheezing, rhonchi or crackles noted.  Heart: S1+S2+0, regular and normal without murmurs, rubs or gallops noted.   Abdomen: Soft, non-tender and non-distended with normal bowel sounds appreciated on auscultation.  Extremities: There is no pitting edema in the distal lower extremities bilaterally. Pedal pulses are intact.  Skin: Warm and dry without trophic changes noted.  Musculoskeletal: exam reveals no obvious joint deformities, tenderness or joint swelling or erythema.   Neurologically:  Mental status: The patient is awake, alert and oriented in all 4 spheres. Her immediate and remote memory, attention, language skills and fund of knowledge are impaired. Speech is clear with normal prosody and enunciation. Thought process is linear. Mood is normal and affect is normal.   07/27/2016: MMSE: 22/30, CDT: 1/4, AFT: 12/min.   On 01/27/2016: MMSE: 24/30, CDT: 1/4, AFT: 16/min.  On 07/29/2015: MMSE: 27/30, CDT: 1/4, AFT: 17/min.  On 01/14/2015: MMSE 21/30, CDT: 1/4, AFT is 8/min.  On 08/13/2014: MMSE 20/30,  clock drawing is 1 out of 4 and animal fluency is 7.   On 01/15/14: Her MMSE (Mini-Mental state exam) score is 18/30. CDT (Clock Drawing Test) score is 2/4. AFT (Animal Fluency Test) score is 11.   Cranial nerves II - XII are as described above under HEENT exam. In addition: shoulder shrug is normal with equal shoulder height noted. Motor exam: Normal bulk, strength and tone is noted. There is no drift, rest tremor or rebound.she has a mild postural and action tremor in both upper extremities, stable. Romberg is  negative but with mild swaying noted today. Reflexes are 2+ throughout. Fine motor skills and coordination:  there is slight difficulty globally. No lateralization.  Cerebellar testing: No dysmetria or intention tremor.  Sensory exam: intact to light touch in upper and lower extremities.  Gait, station and balance: She stands easily. No veering to one side is noted. No leaning to one side is noted. Posture is age-appropriate and stance is narrow based. Gait shows normal stride length and normal pace. No problems turning are noted. Tandem walk is unremarkable.   Assessment and Plan:    In summary, Shakeria Robinette Collier is a very pleasant 61 y.o.-year old female with a history of Hyperlipidemia, hypertension, depression, anxiety and smoking, who presents for follow-up consultation of her dementia without behavioral disturbance. She has risk factor for vascular dementia but given her younger age when she started with memory issues, early onset Alzheimer's disease is also possible. She denies any significant family history of dementia. Memory scores had initially improved on medication but for the past year have declined gradually and slowly. She no longer works. Workup in the past with brain MRI showed advanced atrophy, mild white matter changes in 2015 and she also had a brain MRI before then and there was progression of the atrophy. She was not able to pursue neuropsychological evaluation due to financial constraints and she is going to have some changes in her insurance we mutually agreed to forego any major changes today, continue with donepezil generic. I talked to him about potentially pursuing neurocognitive testing because of her younger age and diagnostic help with neuropsychological testing. In addition we may pursue a brain MRI testing for comparison and also in the near future pursue a second medication for memory loss such as Namenda long-acting or generic immediate release.  For now we mutually agreed to  continue with her medication, generic Aricept. I renewed the prescription. I strongly advised her to quit smoking. Her weight has gradually declined over the past year, she is on the lower end of normal weight, she is advised to monitor her nutrition and her weight. I encouraged her to stay well hydrated with water and reduce her soda intake. I answered all their questions today and we will see her back in about 6 months, sooner as needed.  I spent 25 minutes in total face-to-face time with the patient, more than 50% of which was spent in counseling and coordination of care, reviewing test results, reviewing medication and discussing or reviewing the diagnosis of dementia, its prognosis and treatment options. Pertinent laboratory and imaging test results that were available during this visit with the patient were reviewed by me and considered in my medical decision making (see chart for details).

## 2016-07-27 NOTE — Patient Instructions (Signed)
You are going to have change in your insurance and we will wait till next appointment to consider some changes: such as adding a second memory medicine, repeating your brain scan and referring you to a neuropsychologist.  For now, let's continue with the generic donepezil.  Please stop smoking! Monitor appetite and weight.

## 2016-07-28 ENCOUNTER — Other Ambulatory Visit: Payer: Self-pay | Admitting: Physician Assistant

## 2016-07-28 DIAGNOSIS — F329 Major depressive disorder, single episode, unspecified: Secondary | ICD-10-CM

## 2016-07-28 DIAGNOSIS — F419 Anxiety disorder, unspecified: Principal | ICD-10-CM

## 2016-07-28 DIAGNOSIS — F32A Depression, unspecified: Secondary | ICD-10-CM

## 2016-09-01 ENCOUNTER — Other Ambulatory Visit: Payer: Self-pay | Admitting: Physician Assistant

## 2016-09-01 DIAGNOSIS — F329 Major depressive disorder, single episode, unspecified: Secondary | ICD-10-CM

## 2016-09-01 DIAGNOSIS — F419 Anxiety disorder, unspecified: Principal | ICD-10-CM

## 2016-09-01 DIAGNOSIS — F32A Depression, unspecified: Secondary | ICD-10-CM

## 2016-09-02 NOTE — Telephone Encounter (Signed)
Rx printed.  Meds ordered this encounter  Medications  . ALPRAZolam (XANAX) 0.25 MG tablet    Sig: TAKE 1 TABLET BY MOUTH TWICE A DAY AS NEEDED    Dispense:  60 tablet    Refill:  0    Not to exceed 5 additional fills before 01/19/2017

## 2016-09-02 NOTE — Telephone Encounter (Signed)
Please review

## 2016-09-03 NOTE — Telephone Encounter (Signed)
Called to [facility]. 

## 2016-09-29 ENCOUNTER — Other Ambulatory Visit: Payer: Self-pay | Admitting: Physician Assistant

## 2016-09-29 DIAGNOSIS — F419 Anxiety disorder, unspecified: Principal | ICD-10-CM

## 2016-09-29 DIAGNOSIS — F32A Depression, unspecified: Secondary | ICD-10-CM

## 2016-09-29 DIAGNOSIS — F329 Major depressive disorder, single episode, unspecified: Secondary | ICD-10-CM

## 2016-09-29 NOTE — Telephone Encounter (Signed)
Called to [facility]. 

## 2016-09-29 NOTE — Telephone Encounter (Signed)
Rx printed at 104. Will bring to 102 after clinic.  Meds ordered this encounter  Medications  . ALPRAZolam (XANAX) 0.25 MG tablet    Sig: TAKE 1 TABLET BY MOUTH TWICE A DAY AS NEEDED    Dispense:  60 tablet    Refill:  0    Not to exceed 5 additional fills before 01/19/2017

## 2016-09-30 ENCOUNTER — Other Ambulatory Visit: Payer: Self-pay | Admitting: Physician Assistant

## 2016-09-30 DIAGNOSIS — I10 Essential (primary) hypertension: Secondary | ICD-10-CM

## 2016-10-01 ENCOUNTER — Other Ambulatory Visit: Payer: Self-pay | Admitting: Physician Assistant

## 2016-10-01 DIAGNOSIS — E7800 Pure hypercholesterolemia, unspecified: Secondary | ICD-10-CM

## 2016-11-01 ENCOUNTER — Other Ambulatory Visit: Payer: Self-pay | Admitting: Physician Assistant

## 2016-11-01 DIAGNOSIS — F419 Anxiety disorder, unspecified: Secondary | ICD-10-CM

## 2016-11-01 DIAGNOSIS — F32A Depression, unspecified: Secondary | ICD-10-CM

## 2016-11-01 DIAGNOSIS — E7800 Pure hypercholesterolemia, unspecified: Secondary | ICD-10-CM

## 2016-11-01 DIAGNOSIS — F329 Major depressive disorder, single episode, unspecified: Secondary | ICD-10-CM

## 2016-11-10 ENCOUNTER — Other Ambulatory Visit: Payer: Self-pay | Admitting: Physician Assistant

## 2016-11-10 DIAGNOSIS — F32A Depression, unspecified: Secondary | ICD-10-CM

## 2016-11-10 DIAGNOSIS — F329 Major depressive disorder, single episode, unspecified: Secondary | ICD-10-CM

## 2016-11-10 DIAGNOSIS — F419 Anxiety disorder, unspecified: Principal | ICD-10-CM

## 2016-11-19 DIAGNOSIS — Z0271 Encounter for disability determination: Secondary | ICD-10-CM

## 2016-11-20 ENCOUNTER — Ambulatory Visit (INDEPENDENT_AMBULATORY_CARE_PROVIDER_SITE_OTHER): Payer: BLUE CROSS/BLUE SHIELD | Admitting: Physician Assistant

## 2016-11-20 ENCOUNTER — Encounter: Payer: Self-pay | Admitting: Physician Assistant

## 2016-11-20 VITALS — BP 120/81 | HR 72 | Temp 97.4°F | Resp 18 | Ht 64.0 in | Wt 128.6 lb

## 2016-11-20 DIAGNOSIS — I10 Essential (primary) hypertension: Secondary | ICD-10-CM | POA: Diagnosis not present

## 2016-11-20 DIAGNOSIS — F32A Depression, unspecified: Secondary | ICD-10-CM

## 2016-11-20 DIAGNOSIS — F0390 Unspecified dementia without behavioral disturbance: Secondary | ICD-10-CM | POA: Diagnosis not present

## 2016-11-20 DIAGNOSIS — F419 Anxiety disorder, unspecified: Secondary | ICD-10-CM | POA: Diagnosis not present

## 2016-11-20 DIAGNOSIS — Z0000 Encounter for general adult medical examination without abnormal findings: Secondary | ICD-10-CM | POA: Diagnosis not present

## 2016-11-20 DIAGNOSIS — F329 Major depressive disorder, single episode, unspecified: Secondary | ICD-10-CM | POA: Diagnosis not present

## 2016-11-20 DIAGNOSIS — E7800 Pure hypercholesterolemia, unspecified: Secondary | ICD-10-CM | POA: Diagnosis not present

## 2016-11-20 DIAGNOSIS — Z720 Tobacco use: Secondary | ICD-10-CM | POA: Diagnosis not present

## 2016-11-20 LAB — POCT URINALYSIS DIP (MANUAL ENTRY)
BILIRUBIN UA: NEGATIVE
GLUCOSE UA: NEGATIVE mg/dL
Ketones, POC UA: NEGATIVE mg/dL
Nitrite, UA: NEGATIVE
Protein Ur, POC: NEGATIVE mg/dL
SPEC GRAV UA: 1.01 (ref 1.010–1.025)
UROBILINOGEN UA: 0.2 U/dL
pH, UA: 5 (ref 5.0–8.0)

## 2016-11-20 NOTE — Patient Instructions (Addendum)
   IF you received an x-ray today, you will receive an invoice from [facility]. Please contact [facility] at [phone] with questions or concerns regarding your invoice.   IF you received labwork today, you will receive an invoice from [facility]. Please contact [facility] at [phone] with questions or concerns regarding your invoice.   Our billing staff will not be able to assist you with questions regarding bills from these companies.  You will be contacted with the lab results as soon as they are available. The fastest way to get your results is to activate your My Chart account. Instructions are located on the last page of this paperwork. If you have not heard from us regarding the results in 2 weeks, please contact this office.     Keeping You Healthy  Get These Tests  Blood Pressure- Have your blood pressure checked by your healthcare provider at least once a year.  Normal blood pressure is 120/80.  Weight- Have your body mass index (BMI) calculated to screen for obesity.  BMI is a measure of body fat based on height and weight.  You can calculate your own BMI at www.nhlbisupport.com/bmi/  Cholesterol- Have your cholesterol checked every year.  Diabetes- Have your blood sugar checked every year if you have high blood pressure, high cholesterol, a family history of diabetes or if you are overweight.  Pap Test - Have a pap test every 1 to 5 years if you have been sexually active.  If you are older than 65 and recent pap tests have been normal you may not need additional pap tests.  In addition, if you have had a hysterectomy  for benign disease additional pap tests are not necessary.  Mammogram-Yearly mammograms are essential for early detection of breast cancer  Screening for Colon Cancer- Colonoscopy starting at age 50. Screening may begin sooner depending on your family history and other health conditions.  Follow up colonoscopy as directed by your  Gastroenterologist.  Screening for Osteoporosis- Screening begins at age 65 with bone density scanning, sooner if you are at higher risk for developing Osteoporosis.  Get these medicines  Calcium with Vitamin D- Your body requires 1200-1500 mg of Calcium a day and 800-1000 IU of Vitamin D a day.  You can only absorb 500 mg of Calcium at a time therefore Calcium must be taken in 2 or 3 separate doses throughout the day.  Hormones- Hormone therapy has been associated with increased risk for certain cancers and heart disease.  Talk to your healthcare provider about if you need relief from menopausal symptoms.  Aspirin- Ask your healthcare provider about taking Aspirin to prevent Heart Disease and Stroke.  Get these Immuniztions  Flu shot- Every fall  Pneumonia shot- Once after the age of 65; if you are younger ask your healthcare provider if you need a pneumonia shot.  Tetanus- Every ten years.  Zostavax- Once after the age of 60 to prevent shingles.  Take these steps  Don't smoke- Your healthcare provider can help you quit. For tips on how to quit, ask your healthcare provider or go to www.smokefree.gov or call 1-800 QUIT-NOW.  Be physically active- Exercise 5 days a week for a minimum of 30 minutes.  If you are not already physically active, start slow and gradually work up to 30 minutes of moderate physical activity.  Try walking, dancing, bike riding, swimming, etc.  Eat a healthy diet- Eat a variety of healthy foods such as fruits, vegetables, whole grains, low   fat milk, low fat cheeses, yogurt, lean meats, chicken, fish, eggs, dried beans, tofu, etc.  For more information go to www.thenutritionsource.org  Dental visit- Brush and floss teeth twice daily; visit your dentist twice a year.  Eye exam- Visit your Optometrist or Ophthalmologist yearly.  Drink alcohol in moderation- Limit alcohol intake to one drink or less a day.  Never drink and drive.  Depression- Your emotional  health is as important as your physical health.  If you're feeling down or losing interest in things you normally enjoy, please talk to your healthcare provider.  Seat Belts- can save your life; always wear one  Smoke/Carbon Monoxide detectors- These detectors need to be installed on the appropriate level of your home.  Replace batteries at least once a year.  Violence- If anyone is threatening or hurting you, please tell your healthcare provider.  Living Will/ Health care power of attorney- Discuss with your healthcare provider and family.  

## 2016-11-20 NOTE — Progress Notes (Signed)
Patient ID: Lindsay Collier, female    DOB: [birth year], 61 y.o.   MRN: [id number]  PCP: Porfirio Oar, PA-C   Patient Care Team: Porfirio Oar, PA-C as PCP - General (Physician Assistant) Huston Foley, MD as Attending Physician (Neurology) Conley Rolls, [place], [place] as Referring Physician (Optometry)   Chief Complaint  Patient presents with  . Annual Exam    Subjective:   Presents for [employer].  Cervical Cancer Screening: not a candidate for cervical cancer screening due to hysterectomy. Breast Cancer Screening: normal mammogram in 01/2016 Colorectal Cancer Screening: colonoscopy 2012, normal. Repeat in 2022. Bone Density Testing: not yet a candidate HIV Screening: 2016, COMPLETE STI Screening: Very low risk Seasonal Influenza Vaccination: annually Td/Tdap Vaccination: 10/2013 Pneumococcal Vaccination: not yet a candidate Zoster Vaccination: not yet Frequency of Dental evaluation: wears dentures, no regular dental visits. Frequency of Eye evaluation: annually, has cataracts Exercise: walking about a mile every day  Spends most of her time watching TV. Enjoys the Housewives shows and Crime solving shows. Due to her dementia, she is no longer driving. Any activities must be accompanied, so she isn't getting out much. She and her husband are members of a church community. She denies feeling bored or lonely.    Patient Active Problem List   Diagnosis Date Noted  . Dementia 02/07/2014  . Tobacco use 02/07/2014  . Anxiety and depression 09/22/2012  . HTN (hypertension) 09/30/2011  . Hypercholesterolemia 09/30/2011    Past Medical History:  Diagnosis Date  . Anemia   . Anxiety   . Hyperlipidemia   . Hypertension   . Smoker      Prior to Admission medications   Medication Sig Start Date End Date Taking? Authorizing Provider  ALPRAZolam (XANAX) 0.25 MG tablet TAKE 1 TABLET BY MOUTH TWICE A DAY AS NEEDED 09/29/16  Yes Sabra Sessler, PA-C  donepezil (ARICEPT) 10  MG tablet Take 1 tablet (10 mg total) by mouth at bedtime. 07/27/16  Yes Huston Foley, MD  ferrous sulfate 325 (65 FE) MG tablet Take 325 mg by mouth daily with breakfast.   Yes [provider]  fish oil-omega-3 fatty acids 1000 MG capsule Take 2 g by mouth daily.   Yes [provider]  lisinopril-hydrochlorothiazide (PRINZIDE,ZESTORETIC) 20-25 MG tablet TAKE 1 TABLET BY MOUTH DAILY. 09/30/16  Yes Ivalee Strauser, PA-C  Multiple Vitamin (MULTIVITAMIN) tablet Take 1 tablet by mouth daily.   Yes [provider]  OVER THE COUNTER MEDICATION Magnesium 250mg    Yes [provider]  OVER THE COUNTER MEDICATION Vit D-3 1000iu   Yes [provider]  OVER THE COUNTER MEDICATION Aloe Vera juice once daily   Yes [provider]  PARoxetine (PAXIL) 30 MG tablet TAKE 1 TABLET (30 MG TOTAL) BY MOUTH DAILY. 11/10/16  Yes Sharai Overbay, PA-C  simvastatin (ZOCOR) 20 MG tablet TAKE 1 TABLET BY MOUTH EVERY DAY 11/02/16  Yes Hatsuko Bizzarro, PA-C  fexofenadine (ALLEGRA) 180 MG tablet Take 180 mg by mouth daily.    [provider]    Allergies  Allergen Reactions  . Codeine     Excessive sweating    Past Surgical History:  Procedure Laterality Date  . ABDOMINAL HYSTERECTOMY     total  . CESAREAN SECTION    . COLONOSCOPY     2012  . TONSILLECTOMY AND ADENOIDECTOMY      Family History  Problem Relation Age of Onset  . Hypertension Father   . Heart disease Father   .  Cancer Father        Prostate  . Hypertension Mother   . Asthma Son   . Diabetes Brother     Social History   Social History  . Marital status: Married    Spouse name: [name]  . Number of children: 1  . Years of education: 12+   Occupational History  . [employer]  . DIETARY AIDE [employer]   Social History Main Topics  . Smoking status: Current Every Day Smoker    Types: Cigarettes  . Smokeless tobacco: Never Used      Comment: cutting back,currently smoking 1/2 pk daily  . Alcohol use No  . Drug use: No  . Sexual activity: Yes    Partners: Male    Birth control/ protection: Surgical   Other Topics Concern  . None   Social History Narrative   Lives with her husband and their son. Patient is right handed    Review of Systems  Constitutional: Negative.   HENT: Negative.   Eyes: Positive for visual disturbance (cataracts). Negative for photophobia, pain, discharge, redness and itching.  Respiratory: Negative.   Cardiovascular: Negative.   Gastrointestinal: Negative.   Endocrine: Negative.   Genitourinary: Negative.   Musculoskeletal: Negative.   Skin: Negative.   Allergic/Immunologic: Negative.   Neurological: Positive for tremors. Negative for dizziness, seizures, syncope, facial asymmetry, speech difficulty, light-headedness, numbness and headaches.       Memory deficit  Hematological: Negative.   Psychiatric/Behavioral: Negative.         Objective:  Physical Exam  Constitutional: She is oriented to person, place, and time. Vital signs are normal. She appears well-developed and well-nourished. She is active and cooperative. No distress.  BP 120/81 (BP Location: Right Arm, Patient Position: Sitting, Cuff Size: Normal)   Pulse 72   Temp 97.4 F (36.3 C) (Oral)   Resp 18   Ht 5\' 4"  (1.626 m)   Wt 128 lb 9.6 oz (58.3 kg)   LMP 06/01/2006   SpO2 96%   BMI 22.07 kg/m    HENT:  Head: Normocephalic and atraumatic.  Right Ear: Hearing, tympanic membrane, external ear and ear canal normal. No foreign bodies.  Left Ear: Hearing, tympanic membrane, external ear and ear canal normal. No foreign bodies.  Nose: Nose normal.  Mouth/Throat: Uvula is midline, oropharynx is clear and moist and mucous membranes are normal. No oral lesions. Normal dentition. No dental abscesses or uvula swelling. No oropharyngeal exudate.  Eyes: Conjunctivae, EOM and lids are normal. Pupils are equal, round, and  reactive to light. Right eye exhibits no discharge. Left eye exhibits no discharge. No scleral icterus.  Fundoscopic exam:      The right eye shows no arteriolar narrowing, no AV nicking, no exudate, no hemorrhage and no papilledema. The right eye shows red reflex.       The left eye shows no arteriolar narrowing, no AV nicking, no exudate, no hemorrhage and no papilledema. The left eye shows red reflex.  Neck: Trachea normal, normal range of motion and full passive range of motion without pain. Neck supple. No spinous process tenderness and no muscular tenderness present. No thyroid mass and no thyromegaly present.  Cardiovascular: Normal rate, regular rhythm, normal heart sounds, intact distal pulses and normal pulses.   Pulmonary/Chest: Effort normal and breath sounds normal. Right breast exhibits no inverted nipple, no mass, no nipple discharge, no skin change and no tenderness. Left breast exhibits no  inverted nipple, no mass, no nipple discharge, no skin change and no tenderness. Breasts are symmetrical.  Abdominal: Soft. Bowel sounds are normal.  Musculoskeletal: She exhibits no edema or tenderness.       Cervical back: Normal.       Thoracic back: Normal.       Lumbar back: Normal.  Lymphadenopathy:       Head (right side): No tonsillar, no preauricular, no posterior auricular and no occipital adenopathy present.       Head (left side): No tonsillar, no preauricular, no posterior auricular and no occipital adenopathy present.    She has no cervical adenopathy.       Right: No supraclavicular adenopathy present.       Left: No supraclavicular adenopathy present.  Neurological: She is alert and oriented to person, place, and time. She has normal strength and normal reflexes. No cranial nerve deficit. She exhibits normal muscle tone. Coordination and gait normal.  Skin: Skin is warm, dry and intact. No rash noted. She is not diaphoretic. No cyanosis or erythema. Nails show no clubbing.    Psychiatric: She has a normal mood and affect. Her speech is normal and behavior is normal. Judgment and thought content normal.       Assessment & Plan:   Problem List Items Addressed This Visit    HTN (hypertension) (Chronic)    COntrolled. Continue current treatment.      Relevant Orders   CBC with Differential/Platelet   Comprehensive metabolic panel   TSH   POCT urinalysis dipstick (Completed)   Hypercholesterolemia (Chronic)    Await labs. Adjust regimen as indicated by results.       Relevant Orders   Comprehensive metabolic panel   Lipid panel   Anxiety and depression (Chronic)    Stable. Controlled. Husband to contact me when they need a refill.      Dementia (Chronic)    Managed by Dr. Lucia GaskinsAhern. On Aricept.      Tobacco use    Continues to cut back.       Other Visit Diagnoses    Annual physical exam    -  Primary   Age appropriate health guidance.       Return in about 6 months (around 05/22/2017).   Fernande Brashelle S. Eldean Nanna, PA-C [facility]

## 2016-11-20 NOTE — Assessment & Plan Note (Signed)
Await labs. Adjust regimen as indicated by results.  

## 2016-11-20 NOTE — Assessment & Plan Note (Signed)
Stable. Controlled. Husband to contact me when they need a refill.

## 2016-11-20 NOTE — Assessment & Plan Note (Signed)
COntrolled. Continue current treatment. 

## 2016-11-20 NOTE — Assessment & Plan Note (Signed)
Continues to cut back.

## 2016-11-20 NOTE — Assessment & Plan Note (Signed)
Managed by Dr. Lucia GaskinsAhern. On Aricept.

## 2016-11-21 LAB — LIPID PANEL
CHOLESTEROL TOTAL: 234 mg/dL — AB (ref 100–199)
Chol/HDL Ratio: 5.1 ratio — ABNORMAL HIGH (ref 0.0–4.4)
HDL: 46 mg/dL (ref >39)
LDL CALC: 147 mg/dL — AB (ref 0–99)
TRIGLYCERIDES: 203 mg/dL — AB (ref 0–149)
VLDL CHOLESTEROL CAL: 41 mg/dL — AB (ref 5–40)

## 2016-11-21 LAB — CBC WITH DIFFERENTIAL/PLATELET
BASOS ABS: 0 10*3/uL (ref 0.0–0.2)
Basos: 0 %
EOS (ABSOLUTE): 0.2 10*3/uL (ref 0.0–0.4)
EOS: 2 %
Hematocrit: 44.3 % (ref 34.0–46.6)
Hemoglobin: 14.8 g/dL (ref 11.1–15.9)
IMMATURE GRANULOCYTES: 0 %
Immature Grans (Abs): 0 10*3/uL (ref 0.0–0.1)
LYMPHS ABS: 1.1 10*3/uL (ref 0.7–3.1)
Lymphs: 15 %
MCH: 32.7 pg (ref 26.6–33.0)
MCHC: 33.4 g/dL (ref 31.5–35.7)
MCV: 98 fL — ABNORMAL HIGH (ref 79–97)
MONOCYTES: 8 %
MONOS ABS: 0.6 10*3/uL (ref 0.1–0.9)
NEUTROS PCT: 75 %
Neutrophils Absolute: 5.4 10*3/uL (ref 1.4–7.0)
PLATELETS: 269 10*3/uL (ref 150–379)
RBC: 4.53 x10E6/uL (ref 3.77–5.28)
RDW: 13.1 % (ref 12.3–15.4)
WBC: 7.2 10*3/uL (ref 3.4–10.8)

## 2016-11-21 LAB — COMPREHENSIVE METABOLIC PANEL
ALK PHOS: 113 IU/L (ref 39–117)
ALT: 20 IU/L (ref 0–32)
AST: 23 IU/L (ref 0–40)
Albumin/Globulin Ratio: 1.6 (ref 1.2–2.2)
Albumin: 4.4 g/dL (ref 3.6–4.8)
BUN/Creatinine Ratio: 14 (ref 12–28)
BUN: 12 mg/dL (ref 8–27)
Bilirubin Total: <0.2 mg/dL (ref 0.0–1.2)
CALCIUM: 10.4 mg/dL — AB (ref 8.7–10.3)
CO2: 24 mmol/L (ref 20–29)
CREATININE: 0.85 mg/dL (ref 0.57–1.00)
Chloride: 100 mmol/L (ref 96–106)
GFR calc Af Amer: 86 mL/min/{1.73_m2} (ref >59)
GFR, EST NON AFRICAN AMERICAN: 74 mL/min/{1.73_m2} (ref >59)
GLUCOSE: 83 mg/dL (ref 65–99)
Globulin, Total: 2.8 g/dL (ref 1.5–4.5)
Potassium: 4.7 mmol/L (ref 3.5–5.2)
SODIUM: 139 mmol/L (ref 134–144)
Total Protein: 7.2 g/dL (ref 6.0–8.5)

## 2016-11-21 LAB — TSH: TSH: 1.14 u[IU]/mL (ref 0.450–4.500)

## 2016-12-04 ENCOUNTER — Other Ambulatory Visit: Payer: Self-pay | Admitting: Physician Assistant

## 2016-12-04 DIAGNOSIS — E7800 Pure hypercholesterolemia, unspecified: Secondary | ICD-10-CM

## 2016-12-07 ENCOUNTER — Encounter: Payer: Self-pay | Admitting: Physician Assistant

## 2016-12-07 ENCOUNTER — Other Ambulatory Visit: Payer: Self-pay

## 2016-12-07 MED ORDER — DONEPEZIL HCL 10 MG PO TABS: 10.0000 mg | ORAL_TABLET | Freq: Every day | ORAL | 1 refills | Status: DC

## 2016-12-16 DIAGNOSIS — Z0271 Encounter for disability determination: Secondary | ICD-10-CM

## 2017-01-26 ENCOUNTER — Ambulatory Visit (INDEPENDENT_AMBULATORY_CARE_PROVIDER_SITE_OTHER): Payer: BLUE CROSS/BLUE SHIELD | Admitting: Neurology

## 2017-01-26 ENCOUNTER — Encounter (INDEPENDENT_AMBULATORY_CARE_PROVIDER_SITE_OTHER): Payer: Self-pay

## 2017-01-26 ENCOUNTER — Encounter: Payer: Self-pay | Admitting: Neurology

## 2017-01-26 VITALS — BP 118/85 | HR 102 | Ht 64.0 in | Wt 122.0 lb

## 2017-01-26 DIAGNOSIS — F0390 Unspecified dementia without behavioral disturbance: Secondary | ICD-10-CM

## 2017-01-26 MED ORDER — DONEPEZIL HCL 10 MG PO TABS: 10.0000 mg | ORAL_TABLET | Freq: Every day | ORAL | 3 refills | Status: DC

## 2017-01-26 NOTE — Patient Instructions (Addendum)
Please talk to Lindsay Collier about smoking cessation. She may be able to suggest a medication to help you quit.  Please try to drink enough water and limit your soda intake to 1-2 per day.  Please try to exercise regularly, such as walking.

## 2017-01-26 NOTE — Progress Notes (Signed)
Subjective:    Patient ID: Lindsay Collier is a 61 y.o. female.  HPI     Interim history:   [name] is a 61 year old right-handed woman with an underlying medical history of hyperlipidemia, hypertension, anxiety, depression, and smoking, who presents for follow-up consultation of her memory loss. The patient is accompanied by her husband again today. I last saw her on 07/27/2016, at which time she felt stable. She was not working since November 2017 and had also not been driving a car since then. She was still smoking, had no symptoms in the realm of depression or anxiety. Her MMSE was 22 out of 30 at the time. I suggested we continue with generic Aricept. We talked about a healthy lifestyle, utilizing a neuropsychological evaluation down the road if financially possible and also about adding a second memory medication down the [place].  Today, 01/26/2017 (all dictated new, as well as above notes, some dictation done in note pad or Word, outside of chart, may appear as copied):    She reports feeling stable, some days better, some worse. Smokes about 7-8 cigs per day, per husband, and drinks sodas about 2 to 3 cups per day, also water. She denies any depression or anxiety, her husband believes that she is for the most Part stable. No recent illness or medication changes. Does not take her allergy medicine on a regular basis. Has an appointment with her optometrist coming up. Follow-up with primary care provider in December.    The patient's allergies, current medications, family history, past medical history, past social history, past surgical history and problem list were reviewed and updated as appropriate.   Previously (copied from previous notes for reference):   I saw her on 01/27/2016 at which time she reported no new issues, she was still working, she was still driving, she had no recent issues with driving, no episodes of confusion, no disorientation. She was still smoking, no issues with  depression or anxiety, or husband reported that he had no additional concerns. Her memory scores for a little better than before. She was advised to continue with generic Aricept, stop smoking, increase her water intake, decrease her soda intake, we talked about her driving again and I asked her to proceed with formal neuropsychological testing as she had not been able to do it before.    I saw her on 07/29/15, at which time she reported doing well, no new issues, was still smoking about half pack per day, was trying to drink more water, less sodas. Memory scores were slightly better last time than the time before. I suggested we continue with donepezil 10 mg daily.    I saw her on 01/14/2015, at which time she reported doing well. She was trying to drink enough water. She was still drinking some soda. She was active at work, on her feet a lot. She was driving about 20 minutes to work and from work. Her husband had observed her driving and had no new concerns. She was able to tolerate Aricept at 10 mg daily. Her mood was better. She felt that the Paxil had helped. Unfortunately, she was still smoking. Her MMSE was 21/30, CDT was 1/4, and AFT was 8/min at the time.    I saw her on 08/13/2014, at which time her husband reported that she was able to tolerate Aricept 5 mg daily. She had a recheck on her thyroid function through her primary care physician in September 2015. She was still smoking. She was  still drinking a lot of sodas. She was working and driving. She did get lost one time. She felt that her memory was stable. Her MMSE was 20 out of 30, clock drawing was 1 out of 4 and animal fluency was 7 at the time. I asked her to drink more water and stop smoking. I increased her Aricept to 10 mg. I asked her husband to observe her driving as a did not feel she was completely safe to drive. I talked to her and her husband extensively about her driving last time.   I first met her on 01/15/2014 at the request  of her primary care provider, at which time her husband reported a several year history of memory loss including forgetfulness, easy distractibility, difficulty focusing and following instructions and difficulty following through at work. He felt that her memory loss started about 2-3 years ago, with gradual progression. At the time of her first visit I suggested we proceed with an MRI brain and formal cognitive testing and we also did some blood work. Her TSH was borderline low at 0.395. RPR was negative, vitamin B12 was elevated at 1600. We called and talked to her husband about her blood test results and advised him to pursue repeat thyroid function testing through her primary care physician. Her MMSE at that time was 18, clock drawing was 2 out of 4, animal fluency was 11. She was advised to increase her water intake, reduce her soda intake, and stop smoking. I did not start her on any new medication at the time. She had a brain MRI without contrast on 02/25/2014: Abnormal MRI brain showing  mild changes of chronic microvascular ischemia and  age disproportion generalized cerebral atrophy which appeared to have progressed compared with previous scan from 2012. In addition, personally reviewed the images through the PACS system. I also talked to her husband on the phone on 03/12/2014 about her MRI results and suggested we go ahead and start her on Aricept 5 mg strength. I talked to him about potential side effects. She was seen for a diagnostic interview by Dr. Valentina Shaggy on 05/07/2014 and I reviewed the report. Unfortunately, they did not pursue neuropsychological testing secondary to the cost factor as I understand.   She drives, and has not gotten lost, no issues dressing, cooking.   Symptom onset was gradual and are now gradually worsening. She primarily has been having difficulty with short-term memory such as forgetfulness, misplacing things, asking the same question again and forgetting dates and events.  Familiar faces are easily recognized. She had a CTH wo contrast on 10/22/10 which was reported as normal, but MRA head showed mild cerebellar atrophy.   She reports no recurrent headaches. There is family history of memory loss, perhaps, in her younger brother and maybe her parents, but she is not sure.    There is no report of Auditory Hallucinations and Visual Hallucinations and there are no delusions, such as paranoia.   She has not been on any dementia medications. She had some residual symptoms of depression and anxiety and had an increase in her Paxil to 30 mg. She denies suicidal ideations or homicidal ideations.   The patient denies prior TIA or stroke symptoms, such as sudden onset of one sided weakness, numbness, tingling, slurring of speech or droopy face, hearing loss, tinnitus, diplopia or visual field cut or monocular loss of vision, and denies recurrent headaches.   Of note, the patient reports snoring, and there is no report of witnessed  apneas or choking sensations while asleep. She drinks more than 3 Pepsis per day. She smokes about 1/2 ppd.   His Past Medical History Is Significant For: Past Medical History:  Diagnosis Date  . Anemia   . Anxiety   . Hyperlipidemia   . Hypertension   . Smoker     His Past Surgical History Is Significant For: Past Surgical History:  Procedure Laterality Date  . ABDOMINAL HYSTERECTOMY     total  . CESAREAN SECTION    . COLONOSCOPY     2012  . TONSILLECTOMY AND ADENOIDECTOMY      His Family History Is Significant For: Family History  Problem Relation Age of Onset  . Hypertension Father   . Heart disease Father   . Cancer Father        Prostate  . Hypertension Mother   . Asthma Son   . Diabetes Brother     His Social History Is Significant For: Social History   Social History  . Marital status: Married    Spouse name: Coralyn Mark  . Number of children: 1  . Years of education: 12+   Occupational History  .  former [employer]  . unemployed     due to memory loss   Social History Main Topics  . Smoking status: Current Every Day Smoker    Types: Cigarettes  . Smokeless tobacco: Never Used     Comment: cutting back,currently smoking 1/2 pk daily  . Alcohol use No  . Drug use: No  . Sexual activity: Yes    Partners: Male    Birth control/ protection: Surgical   Other Topics Concern  . None   Social History Narrative   Lives with her husband and their son. Patient is right handed    His Allergies Are:  Allergies  Allergen Reactions  . Codeine     Excessive sweating  :   His Current Medications Are:  Outpatient Encounter Prescriptions as of 01/26/2017  Medication Sig  . ALPRAZolam (XANAX) 0.25 MG tablet TAKE 1 TABLET BY MOUTH TWICE A DAY AS NEEDED  . donepezil (ARICEPT) 10 MG tablet Take 1 tablet (10 mg total) by mouth at bedtime.  . ferrous sulfate 325 (65 FE) MG tablet Take 325 mg by mouth daily with breakfast.  . fish oil-omega-3 fatty acids 1000 MG capsule Take 2 g by mouth daily.  Marland Kitchen lisinopril-hydrochlorothiazide (PRINZIDE,ZESTORETIC) 20-25 MG tablet TAKE 1 TABLET BY MOUTH DAILY.  . Multiple Vitamin (MULTIVITAMIN) tablet Take 1 tablet by mouth daily.  Marland Kitchen OVER THE COUNTER MEDICATION Magnesium 268m  . OVER THE COUNTER MEDICATION Vit D-3 1000iu  . OVER THE COUNTER MEDICATION Aloe Vera juice once daily  . PARoxetine (PAXIL) 30 MG tablet TAKE 1 TABLET (30 MG TOTAL) BY MOUTH DAILY.  . simvastatin (ZOCOR) 20 MG tablet TAKE 1 TABLET BY MOUTH EVERY DAY  . [DISCONTINUED] fexofenadine (ALLEGRA) 180 MG tablet Take 180 mg by mouth daily.   No facility-administered encounter medications on file as of 01/26/2017.   :  Review of Systems:  Out of a complete 14 point review of systems, all are reviewed and negative with the exception of these symptoms as listed below: Review of Systems  Neurological:       Pt presents today to discuss her memory. Pt feels that she has  good and bad days with regards to her memory. Pt is tolerating aricept qhs with no side effects.  Objective:  Neurological Exam  Physical Exam Physical Examination:   Vitals:   01/26/17 0827  BP: 118/85  Pulse: (!) 102   General Examination: The patient is a very pleasant 61 y.o. female in no acute distress. She appears well-developed and well-nourished and adequately groomed.   HEENT: Normocephalic, atraumatic, pupils are equal, round and reactive to light and accommodation. Bilateral cataracts, seem more progressed. Has eye glasses. Extraocular tracking is difficult for her worse. No nystagmus. Hearing is grossly intact. Face is symmetric with normal facial animation and normal facial sensation. Speech is clear with no dysarthria noted. There is no hypophonia. There is a mild head and neck tremor. No significant voice tremor, no lip tremor. Neck is supple with full range of passive and active motion. Oropharynx exam reveals: mod mouth dryness, adequate dental hygiene.  Mallampati is class II. Tongue protrudes centrally and palate elevates symmetrically.   Chest: Clear to auscultation without wheezing, rhonchi or crackles noted.  Heart: S1+S2+0, regular and normal without murmurs, rubs or gallops noted.   Abdomen: Soft, non-tender and non-distended with normal bowel sounds appreciated on auscultation.  Extremities: There is no pitting edema in the distal lower extremities bilaterally. Pedal pulses are intact.  Skin: Warm and dry without trophic changes noted.  Musculoskeletal: exam reveals no obvious joint deformities, tenderness or joint swelling or erythema.   Neurologically:  Mental status: The patient is awake, alert and oriented in all 4 spheres. Her immediate and remote memory, attention, language skills and fund of knowledge are impaired. Speech is clear with normal prosody and enunciation. Thought process is linear. Mood is normal and affect is normal.   On  01/26/2017: MMSE: 24/30, CDT: 1/4, AFT 14/min.   On 07/27/2016: MMSE: 22/30, CDT: 1/4, AFT: 12/min.   On 01/27/2016: MMSE: 24/30, CDT: 1/4, AFT: 16/min.  On 07/29/2015: MMSE: 27/30, CDT: 1/4, AFT: 17/min.  On 01/14/2015: MMSE 21/30, CDT: 1/4, AFT is 8/min.  On 08/13/2014: MMSE 20/30, CDT: 1/4, AFT: 7/min.   On 01/15/14: Her MMSE (Mini-Mental state exam) score is 18/30. CDT (Clock Drawing Test) score is 2/4. AFT (Animal Fluency Test) score is 11.   Cranial nerves II - XII are as described above under HEENT exam. In addition: shoulder shrug is normal with equal shoulder height noted. Motor exam: Normal bulk, strength and tone is noted. There is no drift, rest tremor or rebound.she has a mild postural and action tremor in both upper extremities, stable. Romberg is negative but with mild swaying noted today. Reflexes are 2 to 3+ throughout. Fine motor skills and coordination: There is slight difficulty globally. No lateralization.  Cerebellar testing: No dysmetria or intention tremor.  Sensory exam: intact to light touch in upper and lower extremities.  Gait, station and balance: She stands easily. No veering to one side is noted. No leaning to one side is noted. Posture is age-appropriate and stance is narrow based. Gait shows normal stride length and normal pace. No problems turning are noted. Tandem walk is unremarkable.   Assessment and Plan:    In summary, Kandy Towery Collier is a very pleasant 61 year old female with a history of Hyperlipidemia, hypertension, depression, anxiety and smoking, who presents for follow-up consultation of her dementia without behavioral disturbance. She has risk factors for vascular dementia but given her younger age when she started with memory issues, early onset Alzheimer's disease is possible. She denies any significant family history of dementia. Memory scores had initially improved on medication, the scores declined and have in  the recent past been fairly  stable. She no longer works. Workup in the past with brain MRI showed advanced atrophy, mild white matter changes in 2015 and she also had a brain MRI before then and there was progression of the atrophy. She was not able to pursue neuropsychological evaluation due to financial constraints. She may have changes in her insurance and we can  certainly revisit this referral in the near future. For now, she is encouraged to continue with donepezil 10 mg once daily. In the future we may pursue a brain MRI testing for comparison and also a second memory medicine such as Namenda long-acting or immediate release. She is encouraged to with smoking, and advised to bring this up with her primary care provider at her next visit. She is reminded to stay well-hydrated with water, limit her soda intake to 1-2 servings per day and exercise on a regular basis.  I suggested a six-month follow-up with one of our nurse practitioners. I answered all their questions today and she and her husband were in agreement.  I spent 20 minutes in total face-to-face time with the patient, more than 50% of which was spent in counseling and coordination of care, reviewing test results, reviewing medication and discussing or reviewing the diagnosis of dementia, its prognosis and treatment options. Pertinent laboratory and imaging test results that were available during this visit with the patient were reviewed by me and considered in my medical decision making (see chart for details).

## 2017-02-02 ENCOUNTER — Other Ambulatory Visit: Payer: Self-pay | Admitting: Physician Assistant

## 2017-02-02 DIAGNOSIS — F329 Major depressive disorder, single episode, unspecified: Secondary | ICD-10-CM

## 2017-02-02 DIAGNOSIS — F32A Depression, unspecified: Secondary | ICD-10-CM

## 2017-02-02 DIAGNOSIS — F419 Anxiety disorder, unspecified: Principal | ICD-10-CM

## 2017-02-03 ENCOUNTER — Other Ambulatory Visit: Payer: Self-pay | Admitting: Physician Assistant

## 2017-02-03 DIAGNOSIS — Z1231 Encounter for screening mammogram for malignant neoplasm of breast: Secondary | ICD-10-CM

## 2017-02-25 ENCOUNTER — Ambulatory Visit
Admission: RE | Admit: 2017-02-25 | Discharge: 2017-02-25 | Disposition: A | Discharge status: Home / Self Care | Payer: BLUE CROSS/BLUE SHIELD | Source: Ambulatory Visit | Attending: Physician Assistant | Admitting: Physician Assistant

## 2017-02-25 DIAGNOSIS — Z1231 Encounter for screening mammogram for malignant neoplasm of breast: Secondary | ICD-10-CM

## 2017-02-26 ENCOUNTER — Other Ambulatory Visit: Payer: Self-pay | Admitting: Physician Assistant

## 2017-02-26 DIAGNOSIS — R928 Other abnormal and inconclusive findings on diagnostic imaging of breast: Secondary | ICD-10-CM

## 2017-03-09 ENCOUNTER — Ambulatory Visit
Admission: RE | Admit: 2017-03-09 | Discharge: 2017-03-09 | Disposition: A | Discharge status: Home / Self Care | Payer: BLUE CROSS/BLUE SHIELD | Source: Ambulatory Visit | Attending: Physician Assistant | Admitting: Physician Assistant

## 2017-03-09 DIAGNOSIS — R928 Other abnormal and inconclusive findings on diagnostic imaging of breast: Secondary | ICD-10-CM

## 2017-05-06 ENCOUNTER — Other Ambulatory Visit: Payer: Self-pay | Admitting: Physician Assistant

## 2017-05-06 DIAGNOSIS — F419 Anxiety disorder, unspecified: Principal | ICD-10-CM

## 2017-05-06 DIAGNOSIS — F32A Depression, unspecified: Secondary | ICD-10-CM

## 2017-05-06 DIAGNOSIS — F329 Major depressive disorder, single episode, unspecified: Secondary | ICD-10-CM

## 2017-05-21 ENCOUNTER — Ambulatory Visit: Payer: BLUE CROSS/BLUE SHIELD | Admitting: Physician Assistant

## 2017-05-21 ENCOUNTER — Other Ambulatory Visit: Payer: Self-pay

## 2017-05-21 ENCOUNTER — Encounter: Payer: Self-pay | Admitting: Physician Assistant

## 2017-05-21 VITALS — BP 102/70 | HR 98 | Temp 98.8°F | Resp 18 | Ht 64.0 in | Wt 124.2 lb

## 2017-05-21 DIAGNOSIS — E7800 Pure hypercholesterolemia, unspecified: Secondary | ICD-10-CM

## 2017-05-21 DIAGNOSIS — Z23 Encounter for immunization: Secondary | ICD-10-CM | POA: Diagnosis not present

## 2017-05-21 DIAGNOSIS — F419 Anxiety disorder, unspecified: Secondary | ICD-10-CM

## 2017-05-21 DIAGNOSIS — F329 Major depressive disorder, single episode, unspecified: Secondary | ICD-10-CM | POA: Diagnosis not present

## 2017-05-21 DIAGNOSIS — F32A Depression, unspecified: Secondary | ICD-10-CM

## 2017-05-21 DIAGNOSIS — I10 Essential (primary) hypertension: Secondary | ICD-10-CM

## 2017-05-21 DIAGNOSIS — F0390 Unspecified dementia without behavioral disturbance: Secondary | ICD-10-CM | POA: Diagnosis not present

## 2017-05-21 DIAGNOSIS — Z720 Tobacco use: Secondary | ICD-10-CM | POA: Diagnosis not present

## 2017-05-21 MED ORDER — LISINOPRIL-HYDROCHLOROTHIAZIDE 20-25 MG PO TABS: 1.0000 | ORAL_TABLET | Freq: Every day | ORAL | 3 refills | Status: DC

## 2017-05-21 NOTE — Progress Notes (Signed)
Patient ID: Lindsay Collier, female    DOB: March 14, 1956, 61 y.o.   MRN: [id number]  PCP: Porfirio OarJeffery, Wilmore Holsomback, PA-C  Chief Complaint  Patient presents with  . Hypertension  . Hyperlipidemia  . Follow-up    Subjective:   Presents for evaluation of HTN and hyperlipidemia. She is accompanied by her husband, as usual.  Since she left work, her mood has dramatically changed. She no longer needs alprazolam. She continues to use paroxetine, and tolerates it without adverse effects.  Tolerating her other medications as well, including simvastatin, started in June after lipid profile revealed hyperlipidemia.  Continues to smoke. Has seen a commercial for Chantix that warned that it could cause suicidal ideations, so is not interested in that. But, she is also not ready to quit smoking yet.    Review of Systems  Constitutional: Negative.   HENT: Negative for sore throat.   Eyes: Negative for visual disturbance.  Respiratory: Negative for cough, chest tightness, shortness of breath and wheezing.   Cardiovascular: Negative for chest pain and palpitations.  Gastrointestinal: Negative for abdominal pain, diarrhea, nausea and vomiting.  Genitourinary: Negative for dysuria, frequency, hematuria and urgency.  Musculoskeletal: Negative for arthralgias and myalgias.  Skin: Negative for rash.  Neurological: Negative for dizziness, weakness and headaches.  Psychiatric/Behavioral: Negative for decreased concentration. The patient is not nervous/anxious.      Dep[facility] screen [facility] 2[facility] 05/21/2017 05/21/2017 11/20/2016 04/06/2016 09/23/2015  Decreased Interest 0 0 0 0 0  Down, Depressed, Hopeless 0 0 0 0 0  PHQ - 2 Score 0 0 0 0 0  Altered sleeping 0 - - - -  Tired, decreased energy 0 - - - -  Change in appetite 0 - - - -  Feeling bad or failure about yourself  0 - - - -  Trouble concentrating 0 - - - -  Moving slowly or fidgety/restless 0 - - - -  Suicidal thoughts 0 - - - -  PHQ-9 Score 0 - - -  -     Patient Active Problem List   Diagnosis Date Noted  . Dementia 02/07/2014  . Tobacco use 02/07/2014  . Anxiety and depression 09/22/2012  . HTN (hypertension) 09/30/2011  . Hypercholesterolemia 09/30/2011     Prior to Admission medications   Medication Sig Start Date End Date Taking? Authorizing Provider  ALPRAZolam (XANAX) 0.25 MG tablet TAKE 1 TABLET BY MOUTH TWICE A DAY AS NEEDED 09/29/16  Yes Shephanie Romas, PA-C  donepezil (ARICEPT) 10 MG tablet Take 1 tablet (10 mg total) by mouth at bedtime. 01/26/17  Yes Huston FoleyAthar, Saima, MD  ferrous sulfate 325 (65 FE) MG tablet Take 325 mg by mouth daily with breakfast.   Yes [provider]  fish oil-omega-3 fatty acids 1000 MG capsule Take 2 g by mouth daily.   Yes [provider]  lisinopril-hydrochlorothiazide (PRINZIDE,ZESTORETIC) 20-25 MG tablet TAKE 1 TABLET BY MOUTH DAILY. 09/30/16  Yes Neema Fluegge, PA-C  Multiple Vitamin (MULTIVITAMIN) tablet Take 1 tablet by mouth daily.   Yes [provider]  OVER THE COUNTER MEDICATION Magnesium 250mg    Yes [provider]  OVER THE COUNTER MEDICATION Vit D-3 1000iu   Yes [provider]  OVER THE COUNTER MEDICATION Aloe Vera juice once daily   Yes [provider]  PARoxetine (PAXIL) 30 MG tablet TAKE 1 TABLET (30 MG TOTAL) BY MOUTH DAILY. 05/06/17  Yes Steffan Caniglia, PA-C  simvastatin (ZOCOR) 20 MG tablet TAKE 1 TABLET BY MOUTH  EVERY DAY 12/04/16  Yes Porfirio OarJeffery, Maison Agrusa, PA-C     Allergies  Allergen Reactions  . Codeine     Excessive sweating       Objective:  Physical Exam  Constitutional: She is oriented to person, place, and time. She appears well-developed and well-nourished. She is active and cooperative. No distress.  BP 102/70 (BP Location: Left Arm, Patient Position: Sitting, Cuff Size: Normal)   Pulse 98   Temp 98.8 F (37.1 C) (Oral)   Resp 18   Ht 5\' 4"  (1.626 m)   Wt 124 lb 3.2 oz (56.3 kg)   LMP 06/01/2006   SpO2  97%   BMI 21.32 kg/m   HENT:  Head: Normocephalic and atraumatic.  Right Ear: Hearing normal.  Left Ear: Hearing normal.  Eyes: Conjunctivae are normal. No scleral icterus.  Neck: Normal range of motion. Neck supple. No thyromegaly present.  Cardiovascular: Normal rate, regular rhythm and normal heart sounds.  Pulses:      Radial pulses are 2+ on the right side, and 2+ on the left side.  Pulmonary/Chest: Effort normal and breath sounds normal.  Lymphadenopathy:       Head (right side): No tonsillar, no preauricular, no posterior auricular and no occipital adenopathy present.       Head (left side): No tonsillar, no preauricular, no posterior auricular and no occipital adenopathy present.    She has no cervical adenopathy.       Right: No supraclavicular adenopathy present.       Left: No supraclavicular adenopathy present.  Neurological: She is alert and oriented to person, place, and time. No sensory deficit.  Skin: Skin is warm, dry and intact. No rash noted. No cyanosis or erythema. Nails show no clubbing.  Psychiatric: She has a normal mood and affect. Her speech is normal and behavior is normal.           Assessment & Plan:   Problem List Items Addressed This Visit    HTN (hypertension) - Primary (Chronic)    Well controlled. Continue lisinoprilHCT 20-25.      Relevant Medications   lisinopril-hydrochlorothiazide (PRINZIDE,ZESTORETIC) 20-25 MG tablet   Other Relevant Orders   CBC with Differential/Platelet   Comprehensive metabolic panel   Hypercholesterolemia (Chronic)    Await labs. Adjust regimen as indicated by results.      Relevant Medications   lisinopril-hydrochlorothiazide (PRINZIDE,ZESTORETIC) 20-25 MG tablet   Other Relevant Orders   Comprehensive metabolic panel   Lipid panel   Anxiety and depression (Chronic)    Well controlled. No longer needs alprazolam since retired.      Dementia (Chronic)    Tolerating donapezil. Managed by neurology.       Tobacco use    Encouraged smoking cessation. Contemplating, but not yet ready.       Other Visit Diagnoses    Flu vaccine need       Relevant Orders   Flu Vaccine QUAD 36+ mos IM (Completed)       Return in about 6 months (around 11/19/2017) for re-evaluation of blood pressure, cholesterol and mood.   Fernande Brashelle S. Timotheus Salm, PA-C Primary Care at [facility]

## 2017-05-21 NOTE — Assessment & Plan Note (Signed)
Await labs. Adjust regimen as indicated by results.  

## 2017-05-21 NOTE — Patient Instructions (Addendum)
Being Mortal by Vivi FernsAtul Gawande, MD    IF you received an x-ray today, you will receive an invoice from [facility]. Please contact [facility] at [phone] with questions or concerns regarding your invoice.   IF you received labwork today, you will receive an invoice from [facility]. Please contact [facility] at [phone] with questions or concerns regarding your invoice.   Our billing staff will not be able to assist you with questions regarding bills from these companies.  You will be contacted with the lab results as soon as they are available. The fastest way to get your results is to activate your My Chart account. Instructions are located on the last page of this paperwork. If you have not heard from us regarding the results in 2 weeks, please contact this office.    Did you know that you begin to benefit from quitting smoking within the first twenty minutes? It's TRUE.  At 20 minutes: -blood pressure decreases -pulse rate drops -body temperature of hands and feet increases  At 8 hours: -carbon monoxide level in blood drops to normal -oxygen level in blood increases to normal  At 24 hours: -the chance of heart attack decreases  At 48 hours: -nerve endings start regrowing -ability to smell and taste is enhanced  2 weeks-3 months: -circulation improves -walking becomes easier -lung function improves  1-9 months: -coughing, sinus congestion, fatigue and shortness of breath decreases  1 year: -excess risk of heart disease is decreased to HALF that of a smoker  5 years: Stroke risk is reduced to that of people who have never smoked  10 years: -risk of lung cancer drops to as little as half that of continuing smokers -risk of cancer of the mouth, throat, esophagus, bladder, kidney and pancreas decreases -risk of ulcer decreases  15 years -risk of heart disease is now similar to that of people who have never smoked -risk of death returns to nearly  the level of people who have never smoked

## 2017-05-21 NOTE — Assessment & Plan Note (Signed)
Encouraged smoking cessation. Contemplating, but not yet ready.

## 2017-05-21 NOTE — Assessment & Plan Note (Addendum)
Tolerating donapezil. Managed by neurology.

## 2017-05-21 NOTE — Assessment & Plan Note (Signed)
Well controlled. Continue lisinoprilHCT 20-25.

## 2017-05-21 NOTE — Assessment & Plan Note (Signed)
Well controlled. No longer needs alprazolam since retired.

## 2017-05-22 LAB — COMPREHENSIVE METABOLIC PANEL
A/G RATIO: 1.5 (ref 1.2–2.2)
ALK PHOS: 133 IU/L — AB (ref 39–117)
ALT: 21 IU/L (ref 0–32)
AST: 30 IU/L (ref 0–40)
Albumin: 4.5 g/dL (ref 3.6–4.8)
BILIRUBIN TOTAL: 0.2 mg/dL (ref 0.0–1.2)
BUN/Creatinine Ratio: 20 (ref 12–28)
BUN: 16 mg/dL (ref 8–27)
CHLORIDE: 99 mmol/L (ref 96–106)
CO2: 24 mmol/L (ref 20–29)
Calcium: 10.3 mg/dL (ref 8.7–10.3)
Creatinine, Ser: 0.79 mg/dL (ref 0.57–1.00)
GFR calc Af Amer: 93 mL/min/{1.73_m2} (ref >59)
GFR calc non Af Amer: 80 mL/min/{1.73_m2} (ref >59)
GLUCOSE: 81 mg/dL (ref 65–99)
Globulin, Total: 3 g/dL (ref 1.5–4.5)
POTASSIUM: 4.1 mmol/L (ref 3.5–5.2)
Sodium: 139 mmol/L (ref 134–144)
Total Protein: 7.5 g/dL (ref 6.0–8.5)

## 2017-05-22 LAB — CBC WITH DIFFERENTIAL/PLATELET
BASOS ABS: 0 10*3/uL (ref 0.0–0.2)
Basos: 1 %
EOS (ABSOLUTE): 0.1 10*3/uL (ref 0.0–0.4)
Eos: 2 %
Hematocrit: 40.7 % (ref 34.0–46.6)
Hemoglobin: 14 g/dL (ref 11.1–15.9)
IMMATURE GRANS (ABS): 0 10*3/uL (ref 0.0–0.1)
Immature Granulocytes: 0 %
LYMPHS: 18 %
Lymphocytes Absolute: 1.2 10*3/uL (ref 0.7–3.1)
MCH: 32.2 pg (ref 26.6–33.0)
MCHC: 34.4 g/dL (ref 31.5–35.7)
MCV: 94 fL (ref 79–97)
MONOS ABS: 0.6 10*3/uL (ref 0.1–0.9)
Monocytes: 9 %
NEUTROS ABS: 4.9 10*3/uL (ref 1.4–7.0)
Neutrophils: 70 %
PLATELETS: 337 10*3/uL (ref 150–379)
RBC: 4.35 x10E6/uL (ref 3.77–5.28)
RDW: 13.6 % (ref 12.3–15.4)
WBC: 6.9 10*3/uL (ref 3.4–10.8)

## 2017-05-22 LAB — LIPID PANEL
CHOLESTEROL TOTAL: 179 mg/dL (ref 100–199)
Chol/HDL Ratio: 4 ratio (ref 0.0–4.4)
HDL: 45 mg/dL (ref >39)
LDL Calculated: 106 mg/dL — ABNORMAL HIGH (ref 0–99)
Triglycerides: 142 mg/dL (ref 0–149)
VLDL CHOLESTEROL CAL: 28 mg/dL (ref 5–40)

## 2017-05-26 ENCOUNTER — Encounter: Payer: Self-pay | Admitting: Physician Assistant

## 2017-05-28 NOTE — Progress Notes (Signed)
Letter sent.

## 2017-07-29 ENCOUNTER — Ambulatory Visit: Payer: BLUE CROSS/BLUE SHIELD | Admitting: Adult Health

## 2017-07-29 ENCOUNTER — Encounter: Payer: Self-pay | Admitting: Adult Health

## 2017-07-29 VITALS — BP 130/94 | HR 100 | Ht 64.0 in | Wt 122.0 lb

## 2017-07-29 DIAGNOSIS — R413 Other amnesia: Secondary | ICD-10-CM

## 2017-07-29 NOTE — Progress Notes (Addendum)
PATIENT: Lindsay Collier DOB: August 14, 1955  REASON FOR VISIT: follow up- memory HISTORY FROM: patient  HISTORY OF PRESENT ILLNESS: Today 07/29/17 [name] is a 62 year old female with a history of memory disturbance.  She returns today for follow-up.  She remains on Aricept 10 mg at bedtime.  She feels that her memory has remained.  She lives at home with her husband.  She is able to complete all ADLs independently.  Does not operate a motor vehicle.  She reports that she did have to retire due to her memory.  She denies any hobbies that because of her memory.  Denies any trouble sleeping.  Denies hallucinations.    HISTORY 01/26/17: She reports feeling stable, some days better, some worse. Smokes about 7-8 cigs per day, per husband, and drinks sodas about 2 to 3 cups per day, also water. She denies any depression or anxiety, her husband believes that she is for the most Part stable. No recent illness or medication changes. Does not take her allergy medicine on a regular basis. Has an appointment with her optometrist coming up. Follow-up with primary care provider in December.    REVIEW OF SYSTEMS: Out of a complete 14 system review of symptoms, the patient complains only of the following symptoms, and all other reviewed systems are negative.  See HPI  ALLERGIES: Allergies  Allergen Reactions  . Codeine     Excessive sweating    HOME MEDICATIONS: Outpatient Medications Prior to Visit  Medication Sig Dispense Refill  . ALPRAZolam (XANAX) 0.25 MG tablet TAKE 1 TABLET BY MOUTH TWICE A DAY AS NEEDED 60 tablet 0  . donepezil (ARICEPT) 10 MG tablet Take 1 tablet (10 mg total) by mouth at bedtime. 90 tablet 3  . ferrous sulfate 325 (65 FE) MG tablet Take 325 mg by mouth daily with breakfast.    . fish oil-omega-3 fatty acids 1000 MG capsule Take 2 g by mouth daily.    Marland Kitchen lisinopril-hydrochlorothiazide (PRINZIDE,ZESTORETIC) 20-25 MG tablet Take 1 tablet by mouth daily. 90 tablet 3  .  Multiple Vitamin (MULTIVITAMIN) tablet Take 1 tablet by mouth daily.    Marland Kitchen OVER THE COUNTER MEDICATION Magnesium 250mg     . OVER THE COUNTER MEDICATION Vit D-3 1000iu    . OVER THE COUNTER MEDICATION Aloe Vera juice once daily    . PARoxetine (PAXIL) 30 MG tablet TAKE 1 TABLET (30 MG TOTAL) BY MOUTH DAILY. 90 tablet 0  . simvastatin (ZOCOR) 20 MG tablet TAKE 1 TABLET BY MOUTH EVERY DAY 90 tablet 3   No facility-administered medications prior to visit.     PAST MEDICAL HISTORY: Past Medical History:  Diagnosis Date  . Anemia   . Anxiety   . Hyperlipidemia   . Hypertension   . Smoker     PAST SURGICAL HISTORY: Past Surgical History:  Procedure Laterality Date  . ABDOMINAL HYSTERECTOMY     total  . CESAREAN SECTION    . COLONOSCOPY     2012  . TONSILLECTOMY AND ADENOIDECTOMY      FAMILY HISTORY: Family History  Problem Relation Age of Onset  . Hypertension Father   . Heart disease Father   . Cancer Father        Prostate  . Hypertension Mother   . Asthma Son   . Diabetes Brother     SOCIAL HISTORY: Social History   Socioeconomic History  . Marital status: Married    Spouse name: Lindsay Collier  . Number of children: 1  .  Years of education: 12+  . Highest education level: Not on file  Social Needs  . Financial resource strain: Not on file  . Food insecurity - worry: Not on file  . Food insecurity - inability: Not on file  . Transportation needs - medical: Not on file  . Transportation needs - non-medical: Not on file  Occupational History  . Occupation:  former Production manager: [employer]    Comment: [employer]  . Occupation: unemployed    Comment: due to memory loss  Tobacco Use  . Smoking status: Current Every Day Smoker    Types: Cigarettes  . Smokeless tobacco: Never Used  . Tobacco comment: cutting back,currently smoking 1/2 pk daily  Substance and Sexual Activity  . Alcohol use: No    Alcohol/week: 0.0 oz  . Drug use: No  .  Sexual activity: Yes    Partners: Male    Birth control/protection: Surgical  Other Topics Concern  . Not on file  Social History Narrative   Lives with her husband and their son. Patient is right handed      PHYSICAL EXAM  Vitals:   07/29/17 0718  BP: (!) 130/94  Pulse: 100  Weight: 122 lb (55.3 kg)  Height: 5\' 4"  (1.626 m)   Body mass index is 20.94 kg/m.      Generalized: Well developed, in no acute distress   Neurological examination  Mentation: Alert oriented to time, place, history taking. Follows all commands speech and language fluent MMSE 26/30 Cranial nerve II-XII: Pupils were equal round reactive to light. Extraocular movements were full, visual field were full on confrontational test. Facial sensation and strength were normal. Uvula tongue midline. Head turning and shoulder shrug  were normal and symmetric. Motor: The motor testing reveals 5 over 5 strength of all 4 extremities. Good symmetric motor tone is noted throughout.  Sensory: Sensory testing is intact to soft touch on all 4 extremities. No evidence of extinction is noted.  Coordination: Cerebellar testing reveals good finger-nose-finger and heel-to-shin bilaterally.  Gait and station: Gait is normal.  Reflexes: Deep tendon reflexes are symmetric and normal bilaterally.   DIAGNOSTIC DATA (LABS, IMAGING, TESTING) - I reviewed patient records, labs, notes, testing and imaging myself where available.  Lab Results  Component Value Date   WBC 6.9 05/21/2017   HGB 14.0 05/21/2017   HCT 40.7 05/21/2017   MCV 94 05/21/2017   PLT 337 05/21/2017      Component Value Date/Time   NA 139 05/21/2017 0849   K 4.1 05/21/2017 0849   CL 99 05/21/2017 0849   CO2 24 05/21/2017 0849   GLUCOSE 81 05/21/2017 0849   GLUCOSE 85 04/06/2016 0912   BUN 16 05/21/2017 0849   CREATININE 0.79 05/21/2017 0849   CREATININE 0.78 04/06/2016 0912   CALCIUM 10.3 05/21/2017 0849   PROT 7.5 05/21/2017 0849   ALBUMIN 4.5  05/21/2017 0849   AST 30 05/21/2017 0849   ALT 21 05/21/2017 0849   ALKPHOS 133 (H) 05/21/2017 0849   BILITOT 0.2 05/21/2017 0849   GFRNONAA 80 05/21/2017 0849   GFRAA 93 05/21/2017 0849   Lab Results  Component Value Date   CHOL 179 05/21/2017   HDL 45 05/21/2017   LDLCALC 106 (H) 05/21/2017   TRIG 142 05/21/2017   CHOLHDL 4.0 05/21/2017   Lab Results  Component Value Date   HGBA1C 5.1 01/15/2014   Lab Results  Component Value Date   VITAMINB12 1,600 (H)  01/15/2014   Lab Results  Component Value Date   TSH 1.140 11/20/2016      ASSESSMENT AND PLAN 62 y.o. year old female  has a past medical history of Anemia, Anxiety, Hyperlipidemia, Hypertension, and Smoker. here with :  1.  Memory disturbance  The patient's memory score has remained stable.  She will continue on Aricept 10 mg at bedtime.  She is advised that if her symptoms worsen or she develops new symptoms she should let us know.  She will follow-up in 6 months or sooner if needed.  I spent 15 minutes with the patient. 50% of this time was spent reviewing the records     Butch PennyMegan Elissia Spiewak, MSN, NP-C 07/29/2017, 7:25 AM [facility] [address], [address] [place], [place] [address] [phone]  I reviewed the above note and documentation by the Nurse Practitioner and agree with the history, physical exam, assessment and plan as outlined above. I was immediately available for face-to-face consultation. Huston FoleySaima Athar, MD, PhD [facility]

## 2017-07-29 NOTE — Patient Instructions (Signed)
Your Plan:  Continue Aricept Memory score is stable If your symptoms worsen or you develop new symptoms please let us know.    Thank you for coming to see us at [facility]. I hope we have been able to provide you high quality care today.  You may receive a patient satisfaction survey over the next few weeks. We would appreciate your feedback and comments so that we may continue to improve ourselves and the health of our patients.  

## 2017-08-04 ENCOUNTER — Telehealth: Payer: Self-pay | Admitting: Physician Assistant

## 2017-08-04 NOTE — Telephone Encounter (Signed)
Tried to call pt to reschedule this appt with Chelle. The VM said that the person was not accepting phone calls at this time. If pt calls in, please reschedule pt with Chelle for a different day at her convenience. Thanks!

## 2017-08-07 ENCOUNTER — Other Ambulatory Visit: Payer: Self-pay | Admitting: Physician Assistant

## 2017-08-07 DIAGNOSIS — F419 Anxiety disorder, unspecified: Principal | ICD-10-CM

## 2017-08-07 DIAGNOSIS — F329 Major depressive disorder, single episode, unspecified: Secondary | ICD-10-CM

## 2017-08-07 DIAGNOSIS — F32A Depression, unspecified: Secondary | ICD-10-CM

## 2017-09-06 ENCOUNTER — Other Ambulatory Visit: Payer: Self-pay | Admitting: Neurology

## 2017-09-06 ENCOUNTER — Encounter: Payer: Self-pay | Admitting: Physician Assistant

## 2017-09-08 ENCOUNTER — Other Ambulatory Visit: Payer: Self-pay | Admitting: Neurology

## 2017-11-04 ENCOUNTER — Other Ambulatory Visit: Payer: Self-pay | Admitting: Physician Assistant

## 2017-11-04 DIAGNOSIS — F32A Depression, unspecified: Secondary | ICD-10-CM

## 2017-11-04 DIAGNOSIS — F419 Anxiety disorder, unspecified: Principal | ICD-10-CM

## 2017-11-04 DIAGNOSIS — F329 Major depressive disorder, single episode, unspecified: Secondary | ICD-10-CM

## 2017-11-04 NOTE — Telephone Encounter (Signed)
Paxil 30 mg refill request  LOV  05/21/17 with Porfirio Oarhelle Jeffery  Last refill:  08/09/17  #90  Refills:  0  [id number] - [place], [place] - [address]

## 2017-11-10 ENCOUNTER — Telehealth: Payer: Self-pay | Admitting: Physician Assistant

## 2017-11-10 DIAGNOSIS — F419 Anxiety disorder, unspecified: Principal | ICD-10-CM

## 2017-11-10 DIAGNOSIS — F32A Depression, unspecified: Secondary | ICD-10-CM

## 2017-11-10 DIAGNOSIS — E7800 Pure hypercholesterolemia, unspecified: Secondary | ICD-10-CM

## 2017-11-10 DIAGNOSIS — F329 Major depressive disorder, single episode, unspecified: Secondary | ICD-10-CM

## 2017-11-10 MED ORDER — SIMVASTATIN 20 MG PO TABS: 20.0000 mg | ORAL_TABLET | Freq: Every day | ORAL | 3 refills | Status: DC

## 2017-11-10 MED ORDER — PAROXETINE HCL 30 MG PO TABS: 30.0000 mg | ORAL_TABLET | Freq: Every day | ORAL | 3 refills | Status: DC

## 2017-11-10 NOTE — Telephone Encounter (Signed)
Husband calling to speak to Chelle.    Asked what we could help him with and he declined stating he needs to talk with Chelle./  Also asked for Paxil to be refilled and inquired as to how to get refilled after Chelle leaves.    Messages sent to Chelle.

## 2017-11-10 NOTE — Telephone Encounter (Signed)
Copied from CRM [phone]. Topic: Quick Communication - See Telephone Encounter >> Nov 10, 2017  7:45 AM Jay SchlichterWeikart, Melissa J wrote: CRM for notification. See Telephone encounter for: 11/10/17. Husband is calling - Husband is asking for call from provider,. Please call [phone]

## 2017-11-10 NOTE — Telephone Encounter (Signed)
Call returned to pt. and husband, to discuss their concerns about refill in the future, after Chelle Jeffery leaves [place]. Stated they have already rec'd a phone call, and answer to questions, as well as refill on the Paroxetine.

## 2017-11-10 NOTE — Telephone Encounter (Signed)
Spoke with Glean Salenerry and [facility]. [facility].  Meds ordered this encounter  Medications  . PARoxetine (PAXIL) 30 MG tablet    Sig: Take 1 tablet (30 mg total) by mouth daily. TAKE 1 TABLET (30 MG TOTAL) BY MOUTH DAILY.    Dispense:  90 tablet    Refill:  3  . simvastatin (ZOCOR) 20 MG tablet    Sig: Take 1 tablet (20 mg total) by mouth daily.    Dispense:  90 tablet    Refill:  3    Order Specific Question:   Supervising Provider    Answer:   Clelia CroftSHAW, EVA N [[id number]]

## 2017-11-10 NOTE — Telephone Encounter (Signed)
Copied from CRM [id number]. Topic: Quick Communication - See Telephone Encounter >> Nov 10, 2017  7:41 AM Mare LoanBurton, Donna F wrote: Pt is needing to talk with chelle in [place] to her lisinopril refill that she will need once chelle has left and she would like to know who will be refilling her meds    [phone]

## 2017-11-10 NOTE — Telephone Encounter (Signed)
Copied from CRM [phone]. Topic: Quick Communication - Rx Refill/Question >> Nov 10, 2017  7:48 AM Jay SchlichterWeikart, Melissa J wrote: Medication:PARoxetine (PAXIL) 30 MG tablet  Has the patient contacted their pharmacy? No. (Agent: If no, request that the patient contact the pharmacy for the refill.) (Agent: If yes, when and what did the pharmacy advise?)  Preferred Pharmacy (with phone number or street name): [facility]  Husband also says that he needs to speak to provider about this.  It is not about her lisonipril as previously noted.    Agent: Please be advised that RX refills may take up to 3 business days. We ask that you follow-up with your pharmacy.

## 2017-11-19 ENCOUNTER — Ambulatory Visit: Payer: BLUE CROSS/BLUE SHIELD | Admitting: Physician Assistant

## 2017-12-17 DIAGNOSIS — E7800 Pure hypercholesterolemia, unspecified: Secondary | ICD-10-CM | POA: Diagnosis not present

## 2017-12-17 DIAGNOSIS — F0390 Unspecified dementia without behavioral disturbance: Secondary | ICD-10-CM | POA: Diagnosis not present

## 2017-12-17 DIAGNOSIS — I10 Essential (primary) hypertension: Secondary | ICD-10-CM | POA: Diagnosis not present

## 2017-12-17 DIAGNOSIS — F419 Anxiety disorder, unspecified: Secondary | ICD-10-CM | POA: Diagnosis not present

## 2017-12-17 DIAGNOSIS — Z23 Encounter for immunization: Secondary | ICD-10-CM | POA: Diagnosis not present

## 2018-01-26 ENCOUNTER — Ambulatory Visit: Payer: BLUE CROSS/BLUE SHIELD | Admitting: Adult Health

## 2018-01-26 ENCOUNTER — Encounter: Payer: Self-pay | Admitting: Adult Health

## 2018-01-26 VITALS — BP 126/88 | HR 94 | Ht 64.0 in | Wt 128.0 lb

## 2018-01-26 DIAGNOSIS — F0390 Unspecified dementia without behavioral disturbance: Secondary | ICD-10-CM

## 2018-01-26 MED ORDER — MEMANTINE HCL 28 X 5 MG & 21 X 10 MG PO TABS: ORAL_TABLET | ORAL | 0 refills | Status: DC

## 2018-01-26 NOTE — Progress Notes (Signed)
I have read the note, and I agree with the clinical assessment and plan.  Charles K Willis   

## 2018-01-26 NOTE — Patient Instructions (Addendum)
Your Plan:  Continue aricept Start Namenda titration pack At the beginning of week 4 of the titration pack please call for maintaince prescription   [facility] you [facility]. I hope we have been able to provide you high quality care today.  You may receive a patient satisfaction survey over the next few weeks. We would appreciate your feedback and comments so that we may continue to improve ourselves and the health of our patients.  Memantine Tablets What is this medicine? MEMANTINE (MEM an teen) is used to treat dementia caused by Alzheimer's disease. This medicine may be used for other purposes; ask your health care provider or pharmacist if you have questions. COMMON BRAND NAME(S): Namenda What should I tell my health care provider before I take this medicine? They need to know if you have any of these conditions: -difficulty passing urine -kidney disease -liver disease -seizures -an unusual or allergic reaction to memantine, other medicines, foods, dyes, or preservatives -pregnant or trying to get pregnant -breast-feeding How should I use this medicine? Take this medicine by mouth with a glass of water. Follow the directions on the prescription label. You may take this medicine with or without food. Take your doses at regular intervals. Do not take your medicine more often than directed. Continue to take your medicine even if you feel better. Do not stop taking except on the advice of your doctor or health care professional. Talk to your pediatrician regarding the use of this medicine in children. Special care may be needed. Overdosage: If you think you have taken too much of this medicine contact a poison control center or emergency room at once. NOTE: This medicine is only for you. Do not share this medicine with others. What if I miss a dose? If you miss a dose, take it as soon as you can. If it is almost time for your next dose, take only  that dose. Do not take double or extra doses. If you do not take your medicine for several days, contact your health care provider. Your dose may need to be changed. What may interact with this medicine? -acetazolamide -amantadine -cimetidine -dextromethorphan -dofetilide -hydrochlorothiazide -ketamine -metformin -methazolamide -quinidine -ranitidine -sodium bicarbonate -triamterene This list may not describe all possible interactions. Give your health care provider a list of all the medicines, herbs, non-prescription drugs, or dietary supplements you use. Also tell them if you smoke, drink alcohol, or use illegal drugs. Some items may interact with your medicine. What should I watch for while using this medicine? Visit your doctor or health care professional for regular checks on your progress. Check with your doctor or health care professional if there is no improvement in your symptoms or if they get worse. You may get drowsy or dizzy. Do not drive, use machinery, or do anything that needs mental alertness until you know how this drug affects you. Do not stand or sit up quickly, especially if you are an older patient. This reduces the risk of dizzy or fainting spells. Alcohol can make you more drowsy and dizzy. Avoid alcoholic drinks. What side effects may I notice from receiving this medicine? Side effects that you should report to your doctor or health care professional as soon as possible: -allergic reactions like skin rash, itching or hives, swelling of the face, lips, or tongue -agitation or a feeling of restlessness -depressed mood -dizziness -hallucinations -redness, blistering, peeling or loosening of the skin, including inside the mouth -seizures -vomiting  Side effects that usually do not require medical attention (report to your doctor or health care professional if they continue or are bothersome): -constipation -diarrhea -headache -nausea -trouble sleeping This list  may not describe all possible side effects. Call your doctor for medical advice about side effects. You may report side effects to FDA at 1-800-FDA-1088. Where should I keep my medicine? Keep out of the reach of children. Store at room temperature between 15 degrees and 30 degrees C (59 degrees and 86 degrees F). Throw away any unused medicine after the expiration date. NOTE: This sheet is a summary. It may not cover all possible information. If you have questions about this medicine, talk to your doctor, pharmacist, or health care provider.  2018 Elsevier/Gold Standard (2013-03-06 14:10:42)

## 2018-01-26 NOTE — Progress Notes (Signed)
PATIENT: Lindsay Collier DOB: May 07, 1956  REASON FOR VISIT: follow up HISTORY FROM: patient  HISTORY OF PRESENT ILLNESS: Today 01/26/18:  [name] is a 62 year old female with a history of memory disturbance.  She returns today for follow-up.  She is here with her husband.  She reports that she is able to complete all ADLs independently.  Denies any trouble sleeping.  Reports that she is eating well.  She no longer prepares any meals.  Her husband states that she gave that up a while ago.  She denies any changes in her mood or behavior.  She reports that she recently had a cold and treated it with over-the-counter medication.  Denies any changes with the bowels or bladder.  Denies any pain with urination.  She does not operate a motor vehicle.  Her husband manages all the finances.  The patient is now retired due to her memory.  Her husband feels that her memory has remained the same.  He states that she has trouble signing her name but otherwise he has not noticed any changes.  Patient returns today for evaluation. HISTORY 07/29/17 [name] is a 62 year old female with a history of memory disturbance.  She returns today for follow-up.  She remains on Aricept 10 mg at bedtime.  She feels that her memory has remained.  She lives at home with her husband.  She is able to complete all ADLs independently.  Does not operate a motor vehicle.  She reports that she did have to retire due to her memory.  She denies any hobbies that because of her memory.  Denies any trouble sleeping.  Denies hallucinations  REVIEW OF SYSTEMS: Out of a complete 14 system review of symptoms, the patient complains only of the following symptoms, and all other reviewed systems are negative.  ALLERGIES: Allergies  Allergen Reactions  . Codeine     Excessive sweating    HOME MEDICATIONS: Outpatient Medications Prior to Visit  Medication Sig Dispense Refill  . ALPRAZolam (XANAX) 0.25 MG tablet TAKE 1 TABLET BY MOUTH  TWICE A DAY AS NEEDED 60 tablet 0  . donepezil (ARICEPT) 10 MG tablet Take 1 tablet (10 mg total) by mouth at bedtime. 90 tablet 3  . ferrous sulfate 325 (65 FE) MG tablet Take 325 mg by mouth daily with breakfast.    . fish oil-omega-3 fatty acids 1000 MG capsule Take 2 g by mouth daily.    Marland Kitchen lisinopril-hydrochlorothiazide (PRINZIDE,ZESTORETIC) 20-25 MG tablet Take 1 tablet by mouth daily. 90 tablet 3  . Multiple Vitamin (MULTIVITAMIN) tablet Take 1 tablet by mouth daily.    Marland Kitchen OVER THE COUNTER MEDICATION Magnesium 250mg     . OVER THE COUNTER MEDICATION Vit D-3 1000iu    . OVER THE COUNTER MEDICATION Aloe Vera juice once daily    . PARoxetine (PAXIL) 30 MG tablet Take 1 tablet (30 mg total) by mouth daily. TAKE 1 TABLET (30 MG TOTAL) BY MOUTH DAILY. 90 tablet 3  . simvastatin (ZOCOR) 20 MG tablet Take 1 tablet (20 mg total) by mouth daily. 90 tablet 3   No facility-administered medications prior to visit.     PAST MEDICAL HISTORY: Past Medical History:  Diagnosis Date  . Anemia   . Anxiety   . Hyperlipidemia   . Hypertension   . Smoker     PAST SURGICAL HISTORY: Past Surgical History:  Procedure Laterality Date  . ABDOMINAL HYSTERECTOMY     total  . CESAREAN SECTION    .  COLONOSCOPY     2012  . TONSILLECTOMY AND ADENOIDECTOMY      FAMILY HISTORY: Family History  Problem Relation Age of Onset  . Hypertension Father   . Heart disease Father   . Cancer Father        Prostate  . Hypertension Mother   . Asthma Son   . Diabetes Brother     SOCIAL HISTORY: Social History   Socioeconomic History  . Marital status: Married    Spouse name: Aurther Lofterry  . Number of children: 1  . Years of education: 12+  . Highest education level: Not on file  Occupational History  . 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 Occupation: unemployed    Comment: due to memory loss  Social Needs  . Financial resource strain: Not on file    . Food insecurity:    Worry: Not on file    Inability: Not on file  . Transportation needs:    Medical: Not on file    Non-medical: Not on file  Tobacco Use  . Smoking status: Current Every Day Smoker    Types: Cigarettes  . Smokeless tobacco: Never Used  . Tobacco comment: cutting back,currently smoking 1/2 pk daily  Substance and Sexual Activity  . Alcohol use: No    Alcohol/week: 0.0 standard drinks  . Drug use: No  . Sexual activity: Yes    Partners: Male    Birth control/protection: Surgical  Lifestyle  . Physical activity:    Days per week: Not on file    Minutes per session: Not on file  . Stress: Not on file  Relationships  . Social connections:    Talks on phone: Not on file    Gets together: Not on file    Attends religious service: Not on file    Active member of club or organization: Not on file    Attends meetings of clubs or organizations: Not on file    Relationship status: Not on file  . Intimate partner violence:    Fear of current or ex partner: Not on file    Emotionally abused: Not on file    Physically abused: Not on file    Forced sexual activity: Not on file  Other Topics Concern  . Not on file  Social History Narrative   Lives with her husband and their son. Patient is right handed      PHYSICAL EXAM  Vitals:   01/26/18 0719  Height: 5\' 4"  (1.626 m)   Body mass index is 20.94 kg/m.   MMSE - Mini Mental State Exam 01/26/2018 07/27/2016 01/27/2016  Orientation to time 2 2 4   Orientation to Place 4 5 5   Registration 1 3 3   Attention/ Calculation 0 4 2  Recall 0 3 3  Language- name 2 objects 2 2 2   Language- repeat 1 0 1  Language- follow 3 step command 2 2 2   Language- read & follow direction 1 1 1   Write a sentence 0 0 1  Copy design 0 0 0  Total score 13 22 24      Generalized: Well developed, in no acute distress   Neurological examination  Mentation: Alert. Follows all commands speech and language fluent Cranial nerve  II-XII:  Extraocular movements were full, visual field were full on confrontational test. Facial sensation and strength were normal. Uvula tongue midline. Head turning and shoulder shrug  were normal and  symmetric. Motor: The motor testing reveals 5 over 5 strength of all 4 extremities. Good symmetric motor tone is noted throughout.  Sensory: Sensory testing is intact to soft touch on all 4 extremities. No evidence of extinction is noted.  Coordination: Cerebellar testing reveals good finger-nose-finger and heel-to-shin bilaterally. But difficulty follow directions Gait and station: Gait is normal.  Reflexes: Deep tendon reflexes are symmetric and normal bilaterally.   DIAGNOSTIC DATA (LABS, IMAGING, TESTING) - I reviewed patient records, labs, notes, testing and imaging myself where available.  Lab Results  Component Value Date   WBC 6.9 05/21/2017   HGB 14.0 05/21/2017   HCT 40.7 05/21/2017   MCV 94 05/21/2017   PLT 337 05/21/2017      Component Value Date/Time   NA 139 05/21/2017 0849   K 4.1 05/21/2017 0849   CL 99 05/21/2017 0849   CO2 24 05/21/2017 0849   GLUCOSE 81 05/21/2017 0849   GLUCOSE 85 04/06/2016 0912   BUN 16 05/21/2017 0849   CREATININE 0.79 05/21/2017 0849   CREATININE 0.78 04/06/2016 0912   CALCIUM 10.3 05/21/2017 0849   PROT 7.5 05/21/2017 0849   ALBUMIN 4.5 05/21/2017 0849   AST 30 05/21/2017 0849   ALT 21 05/21/2017 0849   ALKPHOS 133 (H) 05/21/2017 0849   BILITOT 0.2 05/21/2017 0849   GFRNONAA 80 05/21/2017 0849   GFRAA 93 05/21/2017 0849   Lab Results  Component Value Date   CHOL 179 05/21/2017   HDL 45 05/21/2017   LDLCALC 106 (H) 05/21/2017   TRIG 142 05/21/2017   CHOLHDL 4.0 05/21/2017   Lab Results  Component Value Date   HGBA1C 5.1 01/15/2014   Lab Results  Component Value Date   VITAMINB12 1,600 (H) 01/15/2014   Lab Results  Component Value Date   TSH 1.140 11/20/2016      ASSESSMENT AND PLAN 62 y.o. year old female  has a  past medical history of Anemia, Anxiety, Hyperlipidemia, Hypertension, and Smoker. here with :  1.  Memory disturbance  The patient's memory score has declined significantly since last visit.  I will check blood work and a urinalysis to rule out any reversible causes of memory decline.  We will start the patient on Namenda.  I have reviewed his medication with the patient and her husband.  I explained that she will be given a Namenda titration pack.  When she starts week 4 of the titration pack she should call our office for the maintenance prescription.  Her husband voiced understanding.  She will continue on Aricept.  If her symptoms worsen or she develops new symptoms they should let us know.  She will follow-up in 6 months or sooner if needed.   I spent 15 minutes with the patient. 50% of this time was spent reviewing memory score  Butch Penny, MSN, NP-C 01/26/2018, 7:24 AM [facility] [address], [address] [place], [place] [address] [phone]

## 2018-01-27 ENCOUNTER — Telehealth: Payer: Self-pay | Admitting: *Deleted

## 2018-01-27 LAB — CBC WITH DIFFERENTIAL/PLATELET
Basophils Absolute: 0.1 10*3/uL (ref 0.0–0.2)
Basos: 1 %
EOS (ABSOLUTE): 0.3 10*3/uL (ref 0.0–0.4)
EOS: 3 %
HEMATOCRIT: 43.7 % (ref 34.0–46.6)
HEMOGLOBIN: 14.2 g/dL (ref 11.1–15.9)
IMMATURE GRANULOCYTES: 0 %
Immature Grans (Abs): 0 10*3/uL (ref 0.0–0.1)
Lymphocytes Absolute: 1.3 10*3/uL (ref 0.7–3.1)
Lymphs: 15 %
MCH: 32.5 pg (ref 26.6–33.0)
MCHC: 32.5 g/dL (ref 31.5–35.7)
MCV: 100 fL — ABNORMAL HIGH (ref 79–97)
Monocytes Absolute: 0.6 10*3/uL (ref 0.1–0.9)
Monocytes: 7 %
NEUTROS PCT: 74 %
Neutrophils Absolute: 6.4 10*3/uL (ref 1.4–7.0)
Platelets: 362 10*3/uL (ref 150–450)
RBC: 4.37 x10E6/uL (ref 3.77–5.28)
RDW: 13.2 % (ref 12.3–15.4)
WBC: 8.6 10*3/uL (ref 3.4–10.8)

## 2018-01-27 LAB — URINALYSIS, ROUTINE W REFLEX MICROSCOPIC
BILIRUBIN UA: NEGATIVE
Glucose, UA: NEGATIVE
Ketones, UA: NEGATIVE
NITRITE UA: NEGATIVE
PH UA: 5.5 (ref 5.0–7.5)
Protein, UA: NEGATIVE
RBC, UA: NEGATIVE
Specific Gravity, UA: 1.019 (ref 1.005–1.030)
UUROB: 0.2 mg/dL (ref 0.2–1.0)

## 2018-01-27 LAB — COMPREHENSIVE METABOLIC PANEL
ALK PHOS: 130 IU/L — AB (ref 39–117)
ALT: 30 IU/L (ref 0–32)
AST: 30 IU/L (ref 0–40)
Albumin/Globulin Ratio: 1.6 (ref 1.2–2.2)
Albumin: 4.4 g/dL (ref 3.6–4.8)
BUN / CREAT RATIO: 14 (ref 12–28)
BUN: 11 mg/dL (ref 8–27)
Bilirubin Total: <0.2 mg/dL (ref 0.0–1.2)
CALCIUM: 10.4 mg/dL — AB (ref 8.7–10.3)
CO2: 22 mmol/L (ref 20–29)
CREATININE: 0.79 mg/dL (ref 0.57–1.00)
Chloride: 101 mmol/L (ref 96–106)
GFR calc Af Amer: 93 mL/min/{1.73_m2} (ref >59)
GFR, EST NON AFRICAN AMERICAN: 80 mL/min/{1.73_m2} (ref >59)
GLOBULIN, TOTAL: 2.7 g/dL (ref 1.5–4.5)
GLUCOSE: 101 mg/dL — AB (ref 65–99)
Potassium: 4.9 mmol/L (ref 3.5–5.2)
Sodium: 139 mmol/L (ref 134–144)
Total Protein: 7.1 g/dL (ref 6.0–8.5)

## 2018-01-27 LAB — MICROSCOPIC EXAMINATION

## 2018-01-27 NOTE — Telephone Encounter (Signed)
Spoke with patient's husband, [place] on [place] and informed him that his wife's lab results are unremarkable. He verbalized understanding, appreciation.

## 2018-02-14 ENCOUNTER — Other Ambulatory Visit: Payer: Self-pay | Admitting: Physician Assistant

## 2018-02-14 DIAGNOSIS — Z1231 Encounter for screening mammogram for malignant neoplasm of breast: Secondary | ICD-10-CM

## 2018-02-17 ENCOUNTER — Telehealth: Payer: Self-pay | Admitting: Adult Health

## 2018-02-17 MED ORDER — MEMANTINE HCL 10 MG PO TABS: 10.0000 mg | ORAL_TABLET | Freq: Two times a day (BID) | ORAL | 1 refills | Status: DC

## 2018-02-17 NOTE — Telephone Encounter (Signed)
Spoke to husband and pt is tolerating the memantine and is now on 10mg  po bid.  (week 4).  Will prescribe maintenance 10mg  tablets so to take one table po bid.  He verbalized understanding.

## 2018-02-17 NOTE — Telephone Encounter (Signed)
Pts husband Aurther Lofterry on [place] called stating the pt will be starting her 4th week of medication memantine (NAMENDA TITRATION PAK) tablet pack. Requesting a call on how to move forward with meds

## 2018-02-17 NOTE — Telephone Encounter (Signed)
Has appt in 07/2018, gave 90 day supply and one refill.

## 2018-03-14 ENCOUNTER — Ambulatory Visit
Admission: RE | Admit: 2018-03-14 | Discharge: 2018-03-14 | Disposition: A | Discharge status: Home / Self Care | Payer: BLUE CROSS/BLUE SHIELD | Source: Ambulatory Visit | Attending: Physician Assistant | Admitting: Physician Assistant

## 2018-03-14 DIAGNOSIS — Z1231 Encounter for screening mammogram for malignant neoplasm of breast: Secondary | ICD-10-CM | POA: Diagnosis not present

## 2018-03-15 ENCOUNTER — Other Ambulatory Visit: Payer: Self-pay | Admitting: Neurology

## 2018-03-23 DIAGNOSIS — E7800 Pure hypercholesterolemia, unspecified: Secondary | ICD-10-CM | POA: Diagnosis not present

## 2018-03-23 DIAGNOSIS — I10 Essential (primary) hypertension: Secondary | ICD-10-CM | POA: Diagnosis not present

## 2018-03-23 DIAGNOSIS — F0390 Unspecified dementia without behavioral disturbance: Secondary | ICD-10-CM | POA: Diagnosis not present

## 2018-03-23 DIAGNOSIS — F329 Major depressive disorder, single episode, unspecified: Secondary | ICD-10-CM | POA: Diagnosis not present

## 2018-03-23 DIAGNOSIS — F419 Anxiety disorder, unspecified: Secondary | ICD-10-CM | POA: Diagnosis not present

## 2018-03-23 DIAGNOSIS — Z23 Encounter for immunization: Secondary | ICD-10-CM | POA: Diagnosis not present

## 2018-05-03 DIAGNOSIS — H40033 Anatomical narrow angle, bilateral: Secondary | ICD-10-CM | POA: Diagnosis not present

## 2018-05-03 DIAGNOSIS — H2513 Age-related nuclear cataract, bilateral: Secondary | ICD-10-CM | POA: Diagnosis not present

## 2018-08-03 ENCOUNTER — Ambulatory Visit: Payer: BLUE CROSS/BLUE SHIELD | Admitting: Neurology

## 2018-08-03 ENCOUNTER — Encounter: Payer: Self-pay | Admitting: Neurology

## 2018-08-03 VITALS — BP 137/87 | HR 79 | Ht 64.0 in | Wt 140.0 lb

## 2018-08-03 DIAGNOSIS — F0390 Unspecified dementia without behavioral disturbance: Secondary | ICD-10-CM | POA: Diagnosis not present

## 2018-08-03 MED ORDER — MEMANTINE HCL 10 MG PO TABS: 10.0000 mg | ORAL_TABLET | Freq: Two times a day (BID) | ORAL | 3 refills | Status: DC

## 2018-08-03 NOTE — Patient Instructions (Addendum)
We will keep your 2 memory medications the same. Your memory scores have slightly improved. We will continue to monitor.  Follow in 6 months with Butch Penny, NP.

## 2018-08-03 NOTE — Progress Notes (Signed)
Subjective:    Patient ID: Lindsay Collier is a 63 y.o. female.  HPI     Interim history:   Lindsay Collier is a 63 year old right-handed woman with an underlying medical history of hyperlipidemia, hypertension, anxiety, depression, and smoking, who presents for follow-up consultation of her dementia. The patient is accompanied by her husband again today. I last saw her on 01/26/2017, at which time she felt stable. Her MMSE was 24 at the time. She was advised to continue with generic Aricept 10 mg daily.  She saw Lindsay Collier, nurse practitioner in the interim on 07/29/2017 at which time her MMSE was 22 out of 30, as well as on 01/26/2018. At the last visit her MMSE was 13 out of 30. She was started on Namenda at the time.  Today, 08/03/2018 (all dictated new, as well as above notes, some dictation done in note pad or Word, outside of chart, may appear as copied):    She reports feeling stable. She has been tolerating Namenda generic twice daily and continues to take Aricept once daily in the evening. She has gained weight. Per husband, she likes to snack, particularly on popcorn. She tries to hydrate with water and tries to exercise in the form of walking, inside and outside the house. Their only son, age 68, lives with them.   The patient's allergies, current medications, family history, past medical history, past social history, past surgical history and problem list were reviewed and updated as appropriate.    Previously (copied from previous notes for reference):    I saw her on 07/27/2016, at which time she felt stable. She was not working since November 2017 and had also not been driving a car since then. She was still smoking, had no symptoms in the realm of depression or anxiety. Her MMSE was 22 out of 30 at the time. I suggested we continue with generic Aricept. We talked about a healthy lifestyle, utilizing a neuropsychological evaluation down the road if financially possible and also  about adding a second memory medication down the [place].     I saw her on 01/27/2016 at which time she reported no new issues, she was still working, she was still driving, she had no recent issues with driving, no episodes of confusion, no disorientation. She was still smoking, no issues with depression or anxiety, or husband reported that he had no additional concerns. Her memory scores for a little better than before. She was advised to continue with generic Aricept, stop smoking, increase her water intake, decrease her soda intake, we talked about her driving again and I asked her to proceed with formal neuropsychological testing as she had not been able to do it before.   I saw her on 07/29/15, at which time she reported doing well, no new issues, was still smoking about half pack per day, was trying to drink more water, less sodas. Memory scores were slightly better last time than the time before. I suggested we continue with donepezil 10 mg daily.    I saw her on 01/14/2015, at which time she reported doing well. She was trying to drink enough water. She was still drinking some soda. She was active at work, on her feet a lot. She was driving about 20 minutes to work and from work. Her husband had observed her driving and had no new concerns. She was able to tolerate Aricept at 10 mg daily. Her mood was better. She felt that the Paxil had helped. Unfortunately,  she was still smoking. Her MMSE was 21/30, CDT was 1/4, and AFT was 8/min at the time.    I saw her on 08/13/2014, at which time her husband reported that she was able to tolerate Aricept 5 mg daily. She had a recheck on her thyroid function through her primary care physician in September 2015. She was still smoking. She was still drinking a lot of sodas. She was working and driving. She did get lost one time. She felt that her memory was stable. Her MMSE was 20 out of 30, clock drawing was 1 out of 4 and animal fluency was 7 at the time. I asked  her to drink more water and stop smoking. I increased her Aricept to 10 mg. I asked her husband to observe her driving as a did not feel she was completely safe to drive. I talked to her and her husband extensively about her driving last time.   I first met her on 01/15/2014 at the request of her primary care provider, at which time her husband reported a several year history of memory loss including forgetfulness, easy distractibility, difficulty focusing and following instructions and difficulty following through at work. He felt that her memory loss started about 2-3 years ago, with gradual progression. At the time of her first visit I suggested we proceed with an MRI brain and formal cognitive testing and we also did some blood work. Her TSH was borderline low at 0.395. RPR was negative, vitamin B12 was elevated at 1600. We called and talked to her husband about her blood test results and advised him to pursue repeat thyroid function testing through her primary care physician. Her MMSE at that time was 18, clock drawing was 2 out of 4, animal fluency was 11. She was advised to increase her water intake, reduce her soda intake, and stop smoking. I did not start her on any new medication at the time. She had a brain MRI without contrast on 02/25/2014: Abnormal MRI brain showing  mild changes of chronic microvascular ischemia and  age disproportion generalized cerebral atrophy which appeared to have progressed compared with previous scan from 2012. In addition, personally reviewed the images through the PACS system. I also talked to her husband on the phone on 03/12/2014 about her MRI results and suggested we go ahead and start her on Aricept 5 mg strength. I talked to him about potential side effects. She was seen for a diagnostic interview by Dr. Valentina Collier on 05/07/2014 and I reviewed the report. Unfortunately, they did not pursue neuropsychological testing secondary to the cost factor as I understand.   She  drives, and has not gotten lost, no issues dressing, cooking.   Symptom onset was gradual and are now gradually worsening. She primarily has been having difficulty with short-term memory such as forgetfulness, misplacing things, asking the same question again and forgetting dates and events. Familiar faces are easily recognized. She had a CTH wo contrast on 10/22/10 which was reported as normal, but MRA head showed mild cerebellar atrophy.   She reports no recurrent headaches. There is family history of memory loss, perhaps, in her younger brother and maybe her parents, but she is not sure.    There is no report of Auditory Hallucinations and Visual Hallucinations and there are no delusions, such as paranoia.   She has not been on any dementia medications. She had some residual symptoms of depression and anxiety and had an increase in her Paxil to 30 mg. She  denies suicidal ideations or homicidal ideations.   The patient denies prior TIA or stroke symptoms, such as sudden onset of one sided weakness, numbness, tingling, slurring of speech or droopy face, hearing loss, tinnitus, diplopia or visual field cut or monocular loss of vision, and denies recurrent headaches.   Of note, the patient reports snoring, and there is no report of witnessed apneas or choking sensations while asleep. She drinks more than 3 Pepsis per day. She smokes about 1/2 ppd.   Her Past Medical History Is Significant For: Past Medical History:  Diagnosis Date  . Anemia   . Anxiety   . Hyperlipidemia   . Hypertension   . Smoker     Her Past Surgical History Is Significant For: Past Surgical History:  Procedure Laterality Date  . ABDOMINAL HYSTERECTOMY     total  . CESAREAN SECTION    . COLONOSCOPY     2012  . TONSILLECTOMY AND ADENOIDECTOMY      Her Family History Is Significant For: Family History  Problem Relation Age of Onset  . Hypertension Father   . Heart disease Father   . Cancer Father        Prostate   . Hypertension Mother   . Asthma Son   . Diabetes Brother     Her Social History Is Significant For: Social History   Socioeconomic History  . Marital status: Married    Spouse name: Coralyn Mark  . Number of children: 1  . Years of education: 12+  . Highest education level: Not on file  Occupational History  . 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 Occupation: unemployed    Comment: due to memory loss  Social Needs  . Financial resource strain: Not on file  . Food insecurity:    Worry: Not on file    Inability: Not on file  . Transportation needs:    Medical: Not on file    Non-medical: Not on file  Tobacco Use  . Smoking status: Current Every Day Smoker    Packs/day: 0.45    Types: Cigarettes  . Smokeless tobacco: Never Used  . Tobacco comment: cutting back,currently smoking 1/2 pk daily  Substance and Sexual Activity  . Alcohol use: No    Alcohol/week: 0.0 standard drinks  . Drug use: No  . Sexual activity: Yes    Partners: Male    Birth control/protection: Surgical  Lifestyle  . Physical activity:    Days per week: Not on file    Minutes per session: Not on file  . Stress: Not on file  Relationships  . Social connections:    Talks on phone: Not on file    Gets together: Not on file    Attends religious service: Not on file    Active member of club or organization: Not on file    Attends meetings of clubs or organizations: Not on file    Relationship status: Not on file  Other Topics Concern  . Not on file  Social History Narrative   Lives with her husband and their son. Patient is right handed    Her Allergies Are:  Allergies  Allergen Reactions  . Codeine     Excessive sweating  :   Her Current Medications Are:  Outpatient Encounter Medications as of 08/03/2018  Medication Sig  . ALPRAZolam (XANAX) 0.25 MG tablet TAKE 1 TABLET BY MOUTH TWICE A DAY AS NEEDED  .  donepezil (ARICEPT) 10 MG tablet  TAKE 1 TABLET BY MOUTH EVERYDAY AT BEDTIME  . ferrous sulfate 325 (65 FE) MG tablet Take 325 mg by mouth daily with breakfast.  . fish oil-omega-3 fatty acids 1000 MG capsule Take 2 g by mouth daily.  Marland Kitchen lisinopril-hydrochlorothiazide (PRINZIDE,ZESTORETIC) 20-25 MG tablet Take 1 tablet by mouth daily.  . memantine (NAMENDA) 10 MG tablet Take 1 tablet (10 mg total) by mouth 2 (two) times daily.  . Multiple Vitamin (MULTIVITAMIN) tablet Take 1 tablet by mouth daily.  Marland Kitchen OVER THE COUNTER MEDICATION Magnesium 254m  . OVER THE COUNTER MEDICATION Vit D-3 1000iu  . OVER THE COUNTER MEDICATION Aloe Vera juice once daily  . PARoxetine (PAXIL) 30 MG tablet Take 1 tablet (30 mg total) by mouth daily. TAKE 1 TABLET (30 MG TOTAL) BY MOUTH DAILY.  . simvastatin (ZOCOR) 20 MG tablet Take 1 tablet (20 mg total) by mouth daily.   No facility-administered encounter medications on file as of 08/03/2018.   :  Review of Systems:  Out of a complete 14 point review of systems, all are reviewed and negative with the exception of these symptoms as listed below:  Review of Systems  Neurological:       Pt presents today to discuss her memory. Pt feels that her memory is stable.    Objective:  Neurological Exam  Physical Exam Physical Examination:   Vitals:   08/03/18 0817  BP: 137/87  Pulse: 79    General Examination: The patient is a very pleasant 63y.o. female in no acute distress. She appears well-developed and well-nourished and well groomed.   HEENT:Normocephalic, atraumatic, pupils are equal, round and reactive to light and accommodation. Extraocular tracking is difficult for her. No nystagmus. Hearing is grossly intact. Face is symmetric with normal facial animation and normal facial sensation. Speech is clear, but scant, with no dysarthria noted. There is no hypophonia. There is a mild head and neck tremor. No significant voice tremor, no lip tremor. Neck is supple with full range of passive and  active motion. Oropharynx exam reveals: modmouth dryness, adequatedental hygiene. Mallampati is class II. Tongue protrudes centrally and palate elevates symmetrically.   Chest:Clear to auscultation without wheezing, rhonchi or crackles noted.  Heart:S1+S2+0, regular and normal without murmurs, rubs or gallops noted.   Abdomen:Soft, non-tender and non-distended with normal bowel sounds appreciated on auscultation.  Extremities:There is nopitting edema in the distal lower extremities bilaterally.   Skin: Warm and dry without trophic changes noted.  Musculoskeletal: exam reveals no obvious joint deformities, tenderness or joint swelling or erythema.   Neurologically:  Mental status: The patient is awake, alert and oriented to self, county, place and season. Herimmediate and remote memory, attention, language skills and fund of knowledge are impaired. Speech is clear with normal prosody and enunciation. Thought process is linear. Mood is normaland affect is normal.   On 08/03/2018: MMSE: 19/30, CDT: 1/4, AFT: 13/min.  On 01/26/18: MMSE: 13/30.   On 01/26/2017: MMSE: 24/30, CDT: 1/4, AFT 14/min.   On 07/27/2016: MMSE: 22/30, CDT: 1/4, AFT: 12/min.   On 01/27/2016: MMSE: 24/30, CDT: 1/4, AFT: 16/min.  On 07/29/2015: MMSE: 27/30, CDT: 1/4, AFT: 17/min.  On 01/14/2015: MMSE 21/30, CDT: 1/4, AFT is 8/min.  On 08/13/2014: MMSE 20/30, CDT: 1/4, AFT: 7/min.   On 01/15/14: Her MMSE (Mini-Mental state exam) score is 18/30. CDT (Clock Drawing Test) score is 2/4. AFT (Animal Fluency Test) score is 11.   Cranial nerves II - XII are  as described above under HEENT exam.  Motor exam: Normal bulk, strength and tone is noted. There is no tremor, she has a mild postural and action tremor in both upper extremities, stable. Fine motor skills and coordination: There is slight difficulty globally. No lateralization.  Cerebellar testing: No dysmetria or intention tremor.  Sensory exam:  intact to light touch in upperand lower extremities.  Gait, station and balance: Shestands easily. No veering to one side is noted. No leaning to one side is noted. Posture is age-appropriate and stance is narrow based. Gait shows normalstride length and normalpace. No problems turning are noted.   Assessment and Plan:    In summary, Lindsay Collier a very pleasant 63 year old femalewith a history of Hyperlipidemia, hypertension, depression, anxiety and smoking, who presents for follow-up consultation of her dementia without behavioral disturbance. She has risk factors for vascular dementia but given her younger age when she started with memory issues, early onset Alzheimer's disease is likely. She denies any significant family history of dementia. Memory scores had initially improved on medication, the scores declined and have improve again a little bit. She started taking generic Namenda and August 2019. She has been on Aricept for the past few years. She tolerates both medications thankfully. She no longer works. Workup in the past with brain MRI showed advanced atrophy, mild white matter changes in 2015 and she also had a brain MRI before then and there was progression of the atrophy. She was not able to pursue neuropsychological evaluation due to cost. We talked about the importance of healthy lifestyle, good nutrition, good hydration and getting proper rest. She is advised to monitor her weight. She is encouraged to continue with donepezil 10 mg once daily and Namenda generic twice daily. I renewed the prescription. She is up-to-date with the donepezil prescription. She is advised to follow-up in 6 months routinely, she can see Lindsay Collier. NP, again. I answered all their questions today and the patient and her husband were in agreement.

## 2018-10-20 DIAGNOSIS — Z720 Tobacco use: Secondary | ICD-10-CM | POA: Diagnosis not present

## 2018-10-20 DIAGNOSIS — E7800 Pure hypercholesterolemia, unspecified: Secondary | ICD-10-CM | POA: Diagnosis not present

## 2018-10-20 DIAGNOSIS — I10 Essential (primary) hypertension: Secondary | ICD-10-CM | POA: Diagnosis not present

## 2018-10-20 DIAGNOSIS — F0390 Unspecified dementia without behavioral disturbance: Secondary | ICD-10-CM | POA: Diagnosis not present

## 2018-10-20 DIAGNOSIS — F329 Major depressive disorder, single episode, unspecified: Secondary | ICD-10-CM | POA: Diagnosis not present

## 2018-10-20 DIAGNOSIS — F419 Anxiety disorder, unspecified: Secondary | ICD-10-CM | POA: Diagnosis not present

## 2019-02-08 ENCOUNTER — Other Ambulatory Visit: Payer: Self-pay | Admitting: Physician Assistant

## 2019-02-08 ENCOUNTER — Other Ambulatory Visit: Payer: Self-pay

## 2019-02-08 ENCOUNTER — Encounter: Payer: Self-pay | Admitting: Adult Health

## 2019-02-08 ENCOUNTER — Ambulatory Visit (INDEPENDENT_AMBULATORY_CARE_PROVIDER_SITE_OTHER): Payer: Medicare HMO | Admitting: Adult Health

## 2019-02-08 VITALS — BP 117/78 | HR 92 | Temp 97.7°F | Ht 64.0 in | Wt 142.8 lb

## 2019-02-08 DIAGNOSIS — Z1231 Encounter for screening mammogram for malignant neoplasm of breast: Secondary | ICD-10-CM

## 2019-02-08 DIAGNOSIS — F0390 Unspecified dementia without behavioral disturbance: Secondary | ICD-10-CM

## 2019-02-08 DIAGNOSIS — R69 Illness, unspecified: Secondary | ICD-10-CM | POA: Diagnosis not present

## 2019-02-08 NOTE — Patient Instructions (Signed)
Your Plan:  Continue Aricept and namenda If your symptoms worsen or you develop new symptoms please let us know.   Thank you for coming to see us at [facility]. I hope we have been able to provide you high quality care today.  You may receive a patient satisfaction survey over the next few weeks. We would appreciate your feedback and comments so that we may continue to improve ourselves and the health of our patients.  

## 2019-02-08 NOTE — Progress Notes (Addendum)
PATIENT: Lindsay Collier DOB: [birth year]  REASON FOR VISIT: follow up HISTORY FROM: patient  HISTORY OF PRESENT ILLNESS: Today 02/08/19:  [name] is a 63 year old female with a history of dementia.  She returns today for follow-up.  She is with her husband.  She remains on Namenda and Aricept.  Tolerating both medications well.  She is able to complete ADLs with minimal assistance.  She does not operate a motor vehicle.  She no longer prepares meals.  No change in mood or behavior.  Reports that she sleeps well.  She returns today for evaluation.  HISTORY 08/03/2018 (copied from Dr. Teofilo Pod note)   She reports feeling stable. She has been tolerating Namenda generic twice daily and continues to take Aricept once daily in the evening. She has gained weight. Per husband, she likes to snack, particularly on popcorn. She tries to hydrate with water and tries to exercise in the form of walking, inside and outside the house. Their only son, age 52, lives with them.  The patient's allergies, current medications, family history, past medical history, past social history, past surgical history and problem list were reviewed and updated as appropriate.  REVIEW OF SYSTEMS: Out of a complete 14 system review of symptoms, the patient complains only of the following symptoms, and all other reviewed systems are negative.  See HPI  ALLERGIES: Allergies  Allergen Reactions  . Codeine     Excessive sweating    HOME MEDICATIONS: Outpatient Medications Prior to Visit  Medication Sig Dispense Refill  . ALPRAZolam (XANAX) 0.25 MG tablet TAKE 1 TABLET BY MOUTH TWICE A DAY AS NEEDED 60 tablet 0  . donepezil (ARICEPT) 10 MG tablet TAKE 1 TABLET BY MOUTH EVERYDAY AT BEDTIME 90 tablet 3  . ferrous sulfate 325 (65 FE) MG tablet Take 325 mg by mouth daily with breakfast.    . fish oil-omega-3 fatty acids 1000 MG capsule Take 2 g by mouth daily.    Marland Kitchen lisinopril-hydrochlorothiazide  (PRINZIDE,ZESTORETIC) 20-25 MG tablet Take 1 tablet by mouth daily. 90 tablet 3  . memantine (NAMENDA) 10 MG tablet Take 1 tablet (10 mg total) by mouth 2 (two) times daily. 180 tablet 3  . Multiple Vitamin (MULTIVITAMIN) tablet Take 1 tablet by mouth daily.    Marland Kitchen OVER THE COUNTER MEDICATION Magnesium 250mg     . OVER THE COUNTER MEDICATION Vit D-3 1000iu    . OVER THE COUNTER MEDICATION Aloe Vera juice once daily    . PARoxetine (PAXIL) 30 MG tablet Take 1 tablet (30 mg total) by mouth daily. TAKE 1 TABLET (30 MG TOTAL) BY MOUTH DAILY. 90 tablet 3  . simvastatin (ZOCOR) 20 MG tablet Take 1 tablet (20 mg total) by mouth daily. 90 tablet 3   No facility-administered medications prior to visit.     PAST MEDICAL HISTORY: Past Medical History:  Diagnosis Date  . Anemia   . Anxiety   . Hyperlipidemia   . Hypertension   . Smoker     PAST SURGICAL HISTORY: Past Surgical History:  Procedure Laterality Date  . ABDOMINAL HYSTERECTOMY     total  . CESAREAN SECTION    . COLONOSCOPY     2012  . TONSILLECTOMY AND ADENOIDECTOMY      FAMILY HISTORY: Family History  Problem Relation Age of Onset  . Hypertension Father   . Heart disease Father   . Cancer Father        Prostate  . Hypertension Mother   . Asthma Son   .  Diabetes Brother     SOCIAL HISTORY: Social History   Socioeconomic History  . Marital status: Married    Spouse name: Lindsay Collier  . Number of children: 1  . Years of education: 12+  . Highest education level: Not on file  Occupational History  . 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 Occupation: unemployed    Comment: due to memory loss  Social Needs  . Financial resource strain: Not on file  . Food insecurity    Worry: Not on file    Inability: Not on file  . Transportation needs    Medical: Not on file    Non-medical: Not on file  Tobacco Use  . Smoking status: Current Every Day Smoker    Packs/day:  0.45    Types: Cigarettes  . Smokeless tobacco: Never Used  . Tobacco comment: cutting back,currently smoking 1/2 pk daily  Substance and Sexual Activity  . Alcohol use: No    Alcohol/week: 0.0 standard drinks  . Drug use: No  . Sexual activity: Yes    Partners: Male    Birth control/protection: Surgical  Lifestyle  . Physical activity    Days per week: Not on file    Minutes per session: Not on file  . Stress: Not on file  Relationships  . Social Herbalist on phone: Not on file    Gets together: Not on file    Attends religious service: Not on file    Active member of club or organization: Not on file    Attends meetings of clubs or organizations: Not on file    Relationship status: Not on file  . Intimate partner violence    Fear of current or ex partner: Not on file    Emotionally abused: Not on file    Physically abused: Not on file    Forced sexual activity: Not on file  Other Topics Concern  . Not on file  Social History Narrative   Lives with her husband and their son. Patient is right handed      PHYSICAL EXAM  Vitals:   02/08/19 0750  BP: 117/78  Pulse: 92  Temp: 97.7 F (36.5 C)  TempSrc: Oral  Weight: 142 lb 12.8 oz (64.8 kg)  Height: 5\' 4"  (1.626 m)   Body mass index is 24.51 kg/m.  MMSE - Mini Mental State Exam 02/08/2019 08/03/2018 01/26/2018  Not completed: (No Data) - -  Orientation to time 1 1 2   Orientation to Place 4 4 4   Registration 3 3 1   Attention/ Calculation 0 3 0  Recall 0 3 0  Language- name 2 objects 1 2 2   Language- repeat 1 1 1   Language- follow 3 step command 2 1 2   Language- follow 3 step command-comments she didnt place the paper in her lap or on the desk - -  Language- read & follow direction 1 1 1   Write a sentence 0 0 0  Copy design 0 0 0  Total score 13 19 13      Generalized: Well developed, in no acute distress   Neurological examination  Mentation: Alert oriented to time, place, history taking.  Follows all commands speech and language fluent Cranial nerve II-XII: Pupils were equal round reactive to light. Extraocular movements were full, visual field were full on confrontational test. Head turning and shoulder shrug  were normal and symmetric. Motor: The motor testing  reveals 5 over 5 strength of all 4 extremities. Good symmetric motor tone is noted throughout.  Sensory: Sensory testing is intact to soft touch on all 4 extremities. No evidence of extinction is noted.  Coordination: Cerebellar testing reveals good finger-nose-finger and heel-to-shin bilaterally.  Gait and station: Gait is normal. Reflexes: Deep tendon reflexes are symmetric and normal bilaterally.   DIAGNOSTIC DATA (LABS, IMAGING, TESTING) - I reviewed patient records, labs, notes, testing and imaging myself where available.  Lab Results  Component Value Date   WBC 8.6 01/26/2018   HGB 14.2 01/26/2018   HCT 43.7 01/26/2018   MCV 100 (H) 01/26/2018   PLT 362 01/26/2018      Component Value Date/Time   NA 139 01/26/2018 0809   K 4.9 01/26/2018 0809   CL 101 01/26/2018 0809   CO2 22 01/26/2018 0809   GLUCOSE 101 (H) 01/26/2018 0809   GLUCOSE 85 04/06/2016 0912   BUN 11 01/26/2018 0809   CREATININE 0.79 01/26/2018 0809   CREATININE 0.78 04/06/2016 0912   CALCIUM 10.4 (H) 01/26/2018 0809   PROT 7.1 01/26/2018 0809   ALBUMIN 4.4 01/26/2018 0809   AST 30 01/26/2018 0809   ALT 30 01/26/2018 0809   ALKPHOS 130 (H) 01/26/2018 0809   BILITOT <0.2 01/26/2018 0809   GFRNONAA 80 01/26/2018 0809   GFRAA 93 01/26/2018 0809   Lab Results  Component Value Date   CHOL 179 05/21/2017   HDL 45 05/21/2017   LDLCALC 106 (H) 05/21/2017   TRIG 142 05/21/2017   CHOLHDL 4.0 05/21/2017   Lab Results  Component Value Date   HGBA1C 5.1 01/15/2014   Lab Results  Component Value Date   VITAMINB12 1,600 (H) 01/15/2014   Lab Results  Component Value Date   TSH 1.140 11/20/2016      ASSESSMENT AND PLAN 63 y.o.  year old female  has a past medical history of Anemia, Anxiety, Hyperlipidemia, Hypertension, and Smoker. here with:  1.  Dementia  The patient's memory score has declined slightly since last visit.  She will continue on Aricept and Namenda.  I have advised that if her symptoms worsen or she develops new symptoms she should let us know.  She will follow-up in 6 months or sooner if needed.   I spent 15 minutes with the patient. 50% of this time was spent discussing her memory score   Butch PennyMegan Brodyn Depuy, MSN, NP-C 02/08/2019, 8:21 AM [facility] [address], [address] [place], [place] [address] [phone]   I reviewed the above note and documentation by the Nurse Practitioner and agree with the history, exam, assessment and plan as outlined above. I was immediately available for consultation. Huston FoleySaima Athar, MD, PhD [facility])

## 2019-03-08 ENCOUNTER — Other Ambulatory Visit: Payer: Self-pay | Admitting: Neurology

## 2019-03-24 ENCOUNTER — Ambulatory Visit
Admission: RE | Admit: 2019-03-24 | Discharge: 2019-03-24 | Disposition: A | Discharge status: Home / Self Care | Payer: BLUE CROSS/BLUE SHIELD | Source: Ambulatory Visit | Attending: Physician Assistant | Admitting: Physician Assistant

## 2019-03-24 ENCOUNTER — Other Ambulatory Visit: Payer: Self-pay

## 2019-03-24 DIAGNOSIS — Z1231 Encounter for screening mammogram for malignant neoplasm of breast: Secondary | ICD-10-CM | POA: Diagnosis not present

## 2019-04-24 DIAGNOSIS — I10 Essential (primary) hypertension: Secondary | ICD-10-CM | POA: Diagnosis not present

## 2019-04-24 DIAGNOSIS — Z1231 Encounter for screening mammogram for malignant neoplasm of breast: Secondary | ICD-10-CM | POA: Diagnosis not present

## 2019-04-24 DIAGNOSIS — Z23 Encounter for immunization: Secondary | ICD-10-CM | POA: Diagnosis not present

## 2019-04-24 DIAGNOSIS — E7800 Pure hypercholesterolemia, unspecified: Secondary | ICD-10-CM | POA: Diagnosis not present

## 2019-04-24 DIAGNOSIS — Z720 Tobacco use: Secondary | ICD-10-CM | POA: Diagnosis not present

## 2019-05-08 DIAGNOSIS — Z0000 Encounter for general adult medical examination without abnormal findings: Secondary | ICD-10-CM | POA: Diagnosis not present

## 2019-05-08 DIAGNOSIS — Z720 Tobacco use: Secondary | ICD-10-CM | POA: Diagnosis not present

## 2019-08-08 ENCOUNTER — Encounter: Payer: Self-pay | Admitting: Adult Health

## 2019-08-08 ENCOUNTER — Other Ambulatory Visit: Payer: Self-pay

## 2019-08-08 ENCOUNTER — Ambulatory Visit: Payer: Medicare HMO | Admitting: Adult Health

## 2019-08-08 VITALS — BP 132/80 | HR 88 | Temp 97.1°F | Ht 64.0 in | Wt 155.2 lb

## 2019-08-08 DIAGNOSIS — F0390 Unspecified dementia without behavioral disturbance: Secondary | ICD-10-CM

## 2019-08-08 DIAGNOSIS — R69 Illness, unspecified: Secondary | ICD-10-CM | POA: Diagnosis not present

## 2019-08-08 NOTE — Patient Instructions (Signed)
Your Plan:  Continue Aricept and Namenda If your symptoms worsen or you develop new symptoms please let us know.   Thank you for coming to see us at [facility]. I hope we have been able to provide you high quality care today.  You may receive a patient satisfaction survey over the next few weeks. We would appreciate your feedback and comments so that we may continue to improve ourselves and the health of our patients.  

## 2019-08-08 NOTE — Progress Notes (Addendum)
PATIENT: Lindsay Collier DOB: 1955/08/21  REASON FOR VISIT: follow up HISTORY FROM: patient  HISTORY OF PRESENT ILLNESS: Today 08/08/19:  [name] is a 64 year old female with a history of dementia.  She returns today for follow-up.  Remains on Namenda and Aricept.  Feels that her memory has remained stable.  Her husband helps her with the finances.  She also requires some assistance and supervision with ADLs.  She no longer operates a motor vehicle.  Her husband manages her medications and appointments.  She no longer does any cooking.  No changes with her mood or behavior.  HISTORY 02/08/19:  [name] is a 64 year old female with a history of dementia.  She returns today for follow-up.  She is with her husband.  She remains on Namenda and Aricept.  Tolerating both medications well.  She is able to complete ADLs with minimal assistance.  She does not operate a motor vehicle.  She no longer prepares meals.  No change in mood or behavior.  Reports that she sleeps well.  She returns today for evaluation.   REVIEW OF SYSTEMS: Out of a complete 14 system review of symptoms, the patient complains only of the following symptoms, and all other reviewed systems are negative.  See HPI  ALLERGIES: Allergies  Allergen Reactions  . Codeine     Excessive sweating    HOME MEDICATIONS: Outpatient Medications Prior to Visit  Medication Sig Dispense Refill  . ALPRAZolam (XANAX) 0.25 MG tablet TAKE 1 TABLET BY MOUTH TWICE A DAY AS NEEDED 60 tablet 0  . donepezil (ARICEPT) 10 MG tablet TAKE 1 TABLET BY MOUTH EVERYDAY AT BEDTIME 90 tablet 3  . ferrous sulfate 325 (65 FE) MG tablet Take 325 mg by mouth daily with breakfast.    . fish oil-omega-3 fatty acids 1000 MG capsule Take 2 g by mouth daily.    Marland Kitchen lisinopril-hydrochlorothiazide (PRINZIDE,ZESTORETIC) 20-25 MG tablet Take 1 tablet by mouth daily. 90 tablet 3  . memantine (NAMENDA) 10 MG tablet Take 1 tablet (10 mg total) by mouth 2 (two)  times daily. 180 tablet 3  . Multiple Vitamin (MULTIVITAMIN) tablet Take 1 tablet by mouth daily.    Marland Kitchen OVER THE COUNTER MEDICATION Magnesium 250mg     . OVER THE COUNTER MEDICATION Vit D-3 1000iu    . OVER THE COUNTER MEDICATION Aloe Vera juice once daily    . PARoxetine (PAXIL) 30 MG tablet Take 1 tablet (30 mg total) by mouth daily. TAKE 1 TABLET (30 MG TOTAL) BY MOUTH DAILY. 90 tablet 3  . simvastatin (ZOCOR) 20 MG tablet Take 1 tablet (20 mg total) by mouth daily. 90 tablet 3   No facility-administered medications prior to visit.    PAST MEDICAL HISTORY: Past Medical History:  Diagnosis Date  . Anemia   . Anxiety   . Hyperlipidemia   . Hypertension   . Smoker     PAST SURGICAL HISTORY: Past Surgical History:  Procedure Laterality Date  . ABDOMINAL HYSTERECTOMY     total  . CESAREAN SECTION    . COLONOSCOPY     2012  . TONSILLECTOMY AND ADENOIDECTOMY      FAMILY HISTORY: Family History  Problem Relation Age of Onset  . Hypertension Father   . Heart disease Father   . Cancer Father        Prostate  . Hypertension Mother   . Asthma Son   . Diabetes Brother     SOCIAL HISTORY: Social History  Socioeconomic History  . Marital status: Married    Spouse name: Coralyn Mark  . Number of children: 1  . Years of education: 12+  . Highest education level: Not on file  Occupational History  . 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 Occupation: unemployed    Comment: due to memory loss  Tobacco Use  . Smoking status: Current Every Day Smoker    Packs/day: 0.45    Types: Cigarettes  . Smokeless tobacco: Never Used  . Tobacco comment: cutting back,currently smoking 1/2 pk daily  Substance and Sexual Activity  . Alcohol use: No    Alcohol/week: 0.0 standard drinks  . Drug use: No  . Sexual activity: Yes    Partners: Male    Birth control/protection: Surgical  Other Topics Concern  . Not on file  Social  History Narrative   Lives with her husband and their son. Patient is right handed   Social Determinants of Health   Financial Resource Strain:   . Difficulty of Paying Living Expenses: Not on file  Food Insecurity:   . Worried About Charity fundraiser in the Last Year: Not on file  . Ran Out of Food in the Last Year: Not on file  Transportation Needs:   . Lack of Transportation (Medical): Not on file  . Lack of Transportation (Non-Medical): Not on file  Physical Activity:   . Days of Exercise per Week: Not on file  . Minutes of Exercise per Session: Not on file  Stress:   . Feeling of Stress : Not on file  Social Connections:   . Frequency of Communication with Friends and Family: Not on file  . Frequency of Social Gatherings with Friends and Family: Not on file  . Attends Religious Services: Not on file  . Active Member of Clubs or Organizations: Not on file  . Attends Archivist Meetings: Not on file  . Marital Status: Not on file  Intimate Partner Violence:   . Fear of Current or Ex-Partner: Not on file  . Emotionally Abused: Not on file  . Physically Abused: Not on file  . Sexually Abused: Not on file      PHYSICAL EXAM  Vitals:   08/08/19 0812  BP: 132/80  Pulse: 88  Temp: (!) 97.1 F (36.2 C)  TempSrc: Oral  Weight: 155 lb 3.2 oz (70.4 kg)  Height: 5\' 4"  (1.626 m)   Body mass index is 26.64 kg/m.   MMSE - Mini Mental State Exam 08/08/2019 02/08/2019 08/03/2018  Not completed: (No Data) (No Data) -  Orientation to time 1 1 1   Orientation to Place 5 4 4   Registration 3 3 3   Attention/ Calculation 0 0 3  Recall 0 0 3  Language- name 2 objects 2 1 2   Language- repeat 1 1 1   Language- follow 3 step command 1 2 1   Language- follow 3 step command-comments she grabbed the paper with her left hand and held the paper in the air she didnt place the paper in her lap or on the desk -  Language- read & follow direction 1 1 1   Write a sentence 0 0 0  Copy  design 0 0 0  Total score 14 13 19      Generalized: Well developed, in no acute distress   Neurological examination  Mentation: Alert oriented to time, place, history taking. Follows all commands speech and language fluent  Cranial nerve II-XII: Pupils were equal round reactive to light. Extraocular movements were full, visual field were full on confrontational test. Head turning and shoulder shrug  were normal and symmetric. Motor: The motor testing reveals 5 over 5 strength of all 4 extremities. Good symmetric motor tone is noted throughout.  Sensory: Sensory testing is intact to soft touch on all 4 extremities. No evidence of extinction is noted.  Coordination: Cerebellar testing reveals good finger-nose-finger and heel-to-shin bilaterally.  Gait and station: Gait is normal. Reflexes: Deep tendon reflexes are symmetric and normal bilaterally.   DIAGNOSTIC DATA (LABS, IMAGING, TESTING) - I reviewed patient records, labs, notes, testing and imaging myself where available.  Lab Results  Component Value Date   WBC 8.6 01/26/2018   HGB 14.2 01/26/2018   HCT 43.7 01/26/2018   MCV 100 (H) 01/26/2018   PLT 362 01/26/2018      Component Value Date/Time   NA 139 01/26/2018 0809   K 4.9 01/26/2018 0809   CL 101 01/26/2018 0809   CO2 22 01/26/2018 0809   GLUCOSE 101 (H) 01/26/2018 0809   GLUCOSE 85 04/06/2016 0912   BUN 11 01/26/2018 0809   CREATININE 0.79 01/26/2018 0809   CREATININE 0.78 04/06/2016 0912   CALCIUM 10.4 (H) 01/26/2018 0809   PROT 7.1 01/26/2018 0809   ALBUMIN 4.4 01/26/2018 0809   AST 30 01/26/2018 0809   ALT 30 01/26/2018 0809   ALKPHOS 130 (H) 01/26/2018 0809   BILITOT <0.2 01/26/2018 0809   GFRNONAA 80 01/26/2018 0809   GFRAA 93 01/26/2018 0809   Lab Results  Component Value Date   CHOL 179 05/21/2017   HDL 45 05/21/2017   LDLCALC 106 (H) 05/21/2017   TRIG 142 05/21/2017   CHOLHDL 4.0 05/21/2017   Lab Results  Component Value Date   HGBA1C 5.1  01/15/2014   Lab Results  Component Value Date   VITAMINB12 1,600 (H) 01/15/2014   Lab Results  Component Value Date   TSH 1.140 11/20/2016      ASSESSMENT AND PLAN 64 y.o. year old female  has a past medical history of Anemia, Anxiety, Hyperlipidemia, Hypertension, and Smoker. here with:  1.  Memory disturbance  -MMSE is stable 14 out of 30 -Continue to monitor symptoms -Continue Aricept and Namenda -Follow-up in 6 months or sooner if needed  I spent 15 minutes of face-to-face and non-face-to-face time with patient.  This included previsit chart review,  electronic health record documentation, patient education.  Butch Penny, MSN, NP-C 08/08/2019, 8:39 AM [facility] [address], [address] [place], [place] [address] [phone]  I reviewed the above note and documentation by the Nurse Practitioner and agree with the history, exam, assessment and plan as outlined above. I was available for consultation. Huston Foley, MD, PhD [facility])

## 2019-08-19 ENCOUNTER — Other Ambulatory Visit: Payer: Self-pay | Admitting: Neurology

## 2019-11-07 DIAGNOSIS — Z122 Encounter for screening for malignant neoplasm of respiratory organs: Secondary | ICD-10-CM | POA: Diagnosis not present

## 2019-11-07 DIAGNOSIS — F419 Anxiety disorder, unspecified: Secondary | ICD-10-CM | POA: Diagnosis not present

## 2019-11-07 DIAGNOSIS — F0390 Unspecified dementia without behavioral disturbance: Secondary | ICD-10-CM | POA: Diagnosis not present

## 2019-11-07 DIAGNOSIS — Z720 Tobacco use: Secondary | ICD-10-CM | POA: Diagnosis not present

## 2019-11-07 DIAGNOSIS — I10 Essential (primary) hypertension: Secondary | ICD-10-CM | POA: Diagnosis not present

## 2019-11-07 DIAGNOSIS — R69 Illness, unspecified: Secondary | ICD-10-CM | POA: Diagnosis not present

## 2019-11-07 DIAGNOSIS — E7800 Pure hypercholesterolemia, unspecified: Secondary | ICD-10-CM | POA: Diagnosis not present

## 2019-11-07 DIAGNOSIS — F329 Major depressive disorder, single episode, unspecified: Secondary | ICD-10-CM | POA: Diagnosis not present

## 2019-11-18 IMAGING — MG DIGITAL SCREENING BILATERAL MAMMOGRAM WITH TOMO AND CAD
6 of 10 series · 6 of 30 positions shown · non-contrast
Comparison: Previous exam(s).

CLINICAL DATA: Screening.

EXAM:
DIGITAL SCREENING BILATERAL MAMMOGRAM WITH TOMO AND CAD

[R MLO synth-2D]
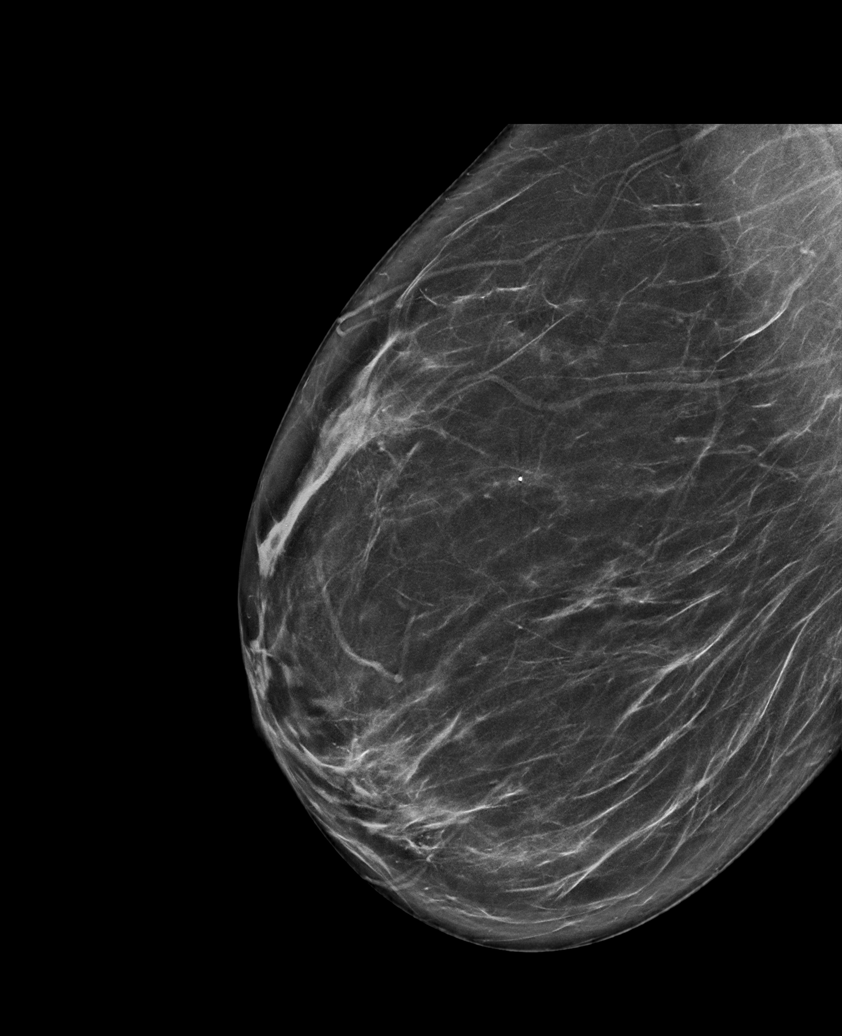

[L CC synth-2D]
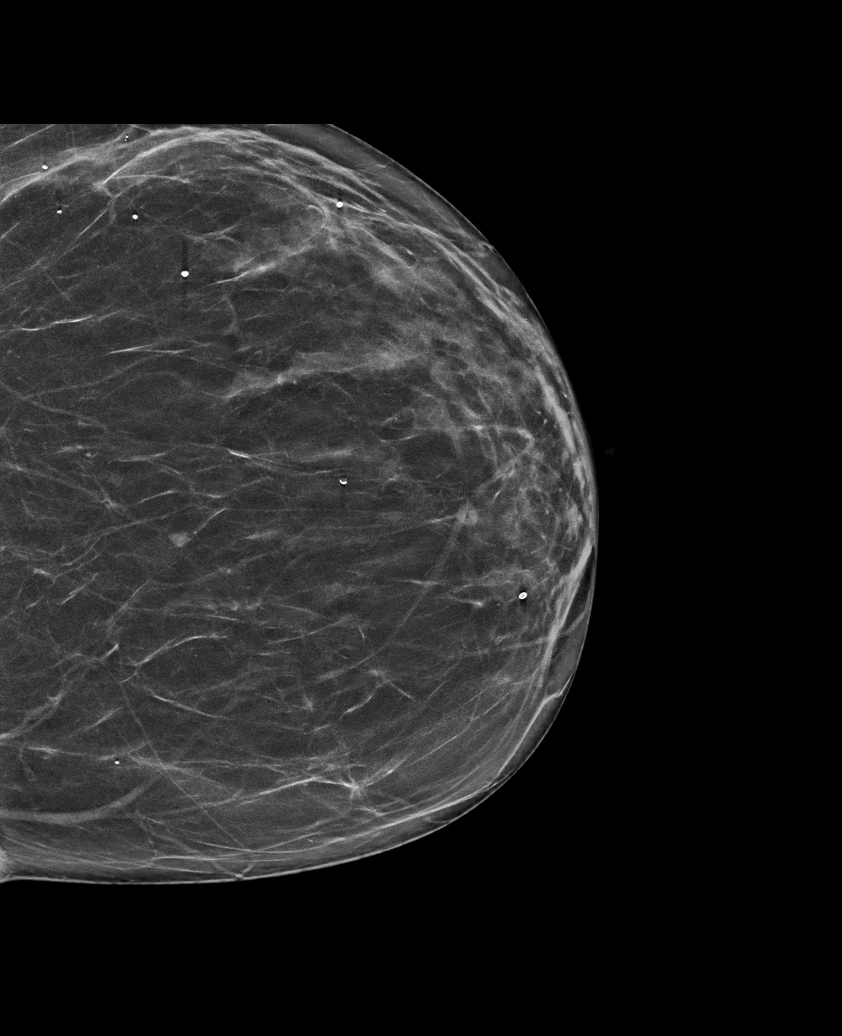

[L MLO synth-2D (1 of 2)]
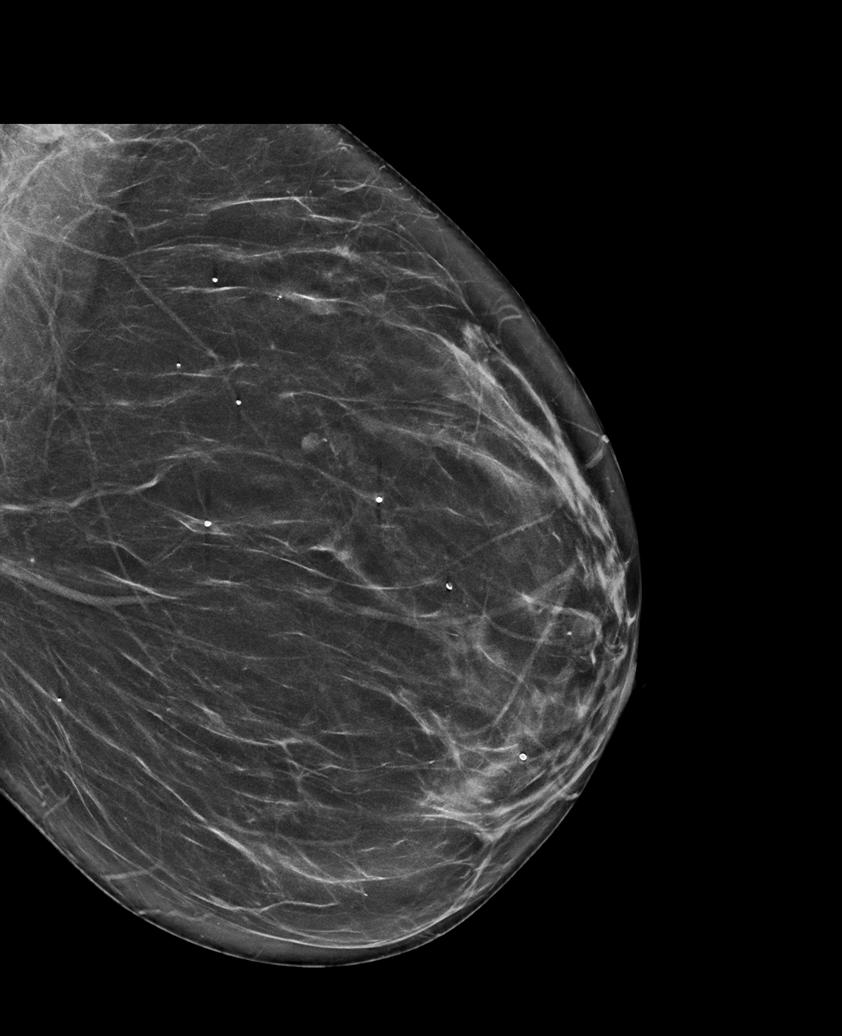

[R CC synth-2D]
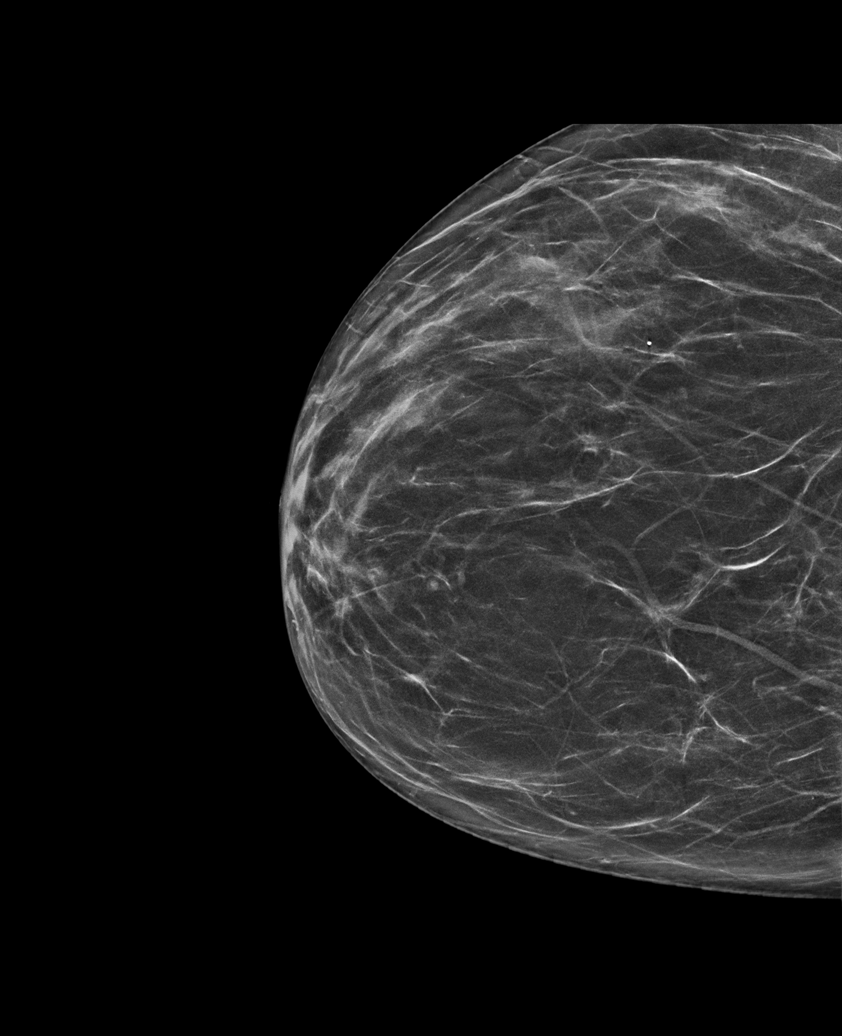

[L MLO synth-2D (2 of 2)]
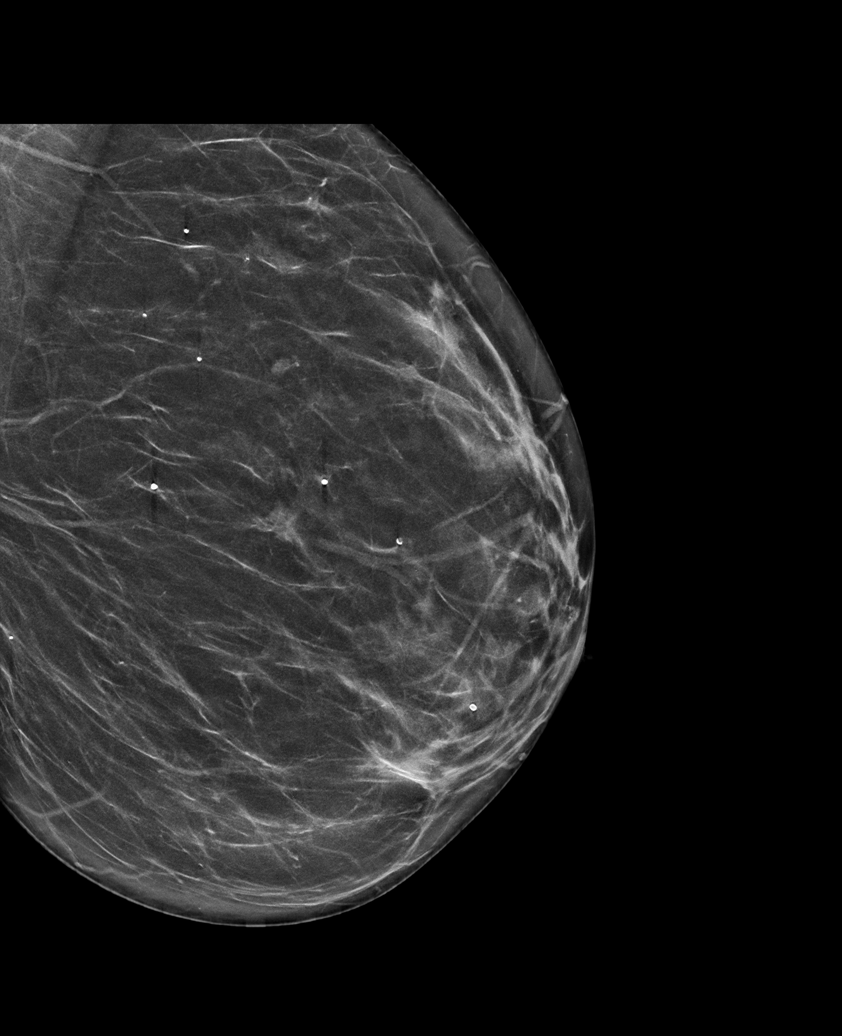

[L MLO tomo · tomo slice 36/71.0]
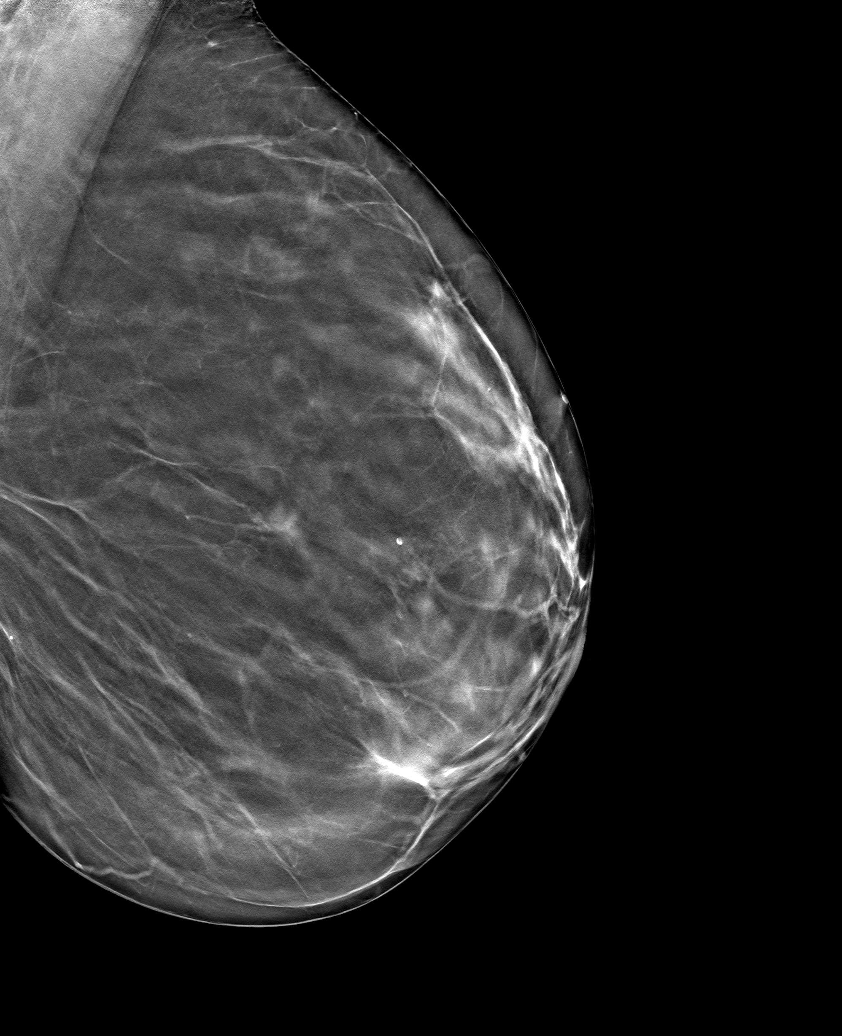

[6 of 30 positions shown; findings below may reference images not displayed]

ACR Breast Density Category b: There are scattered areas of
fibroglandular density.
FINDINGS: There are no findings suspicious for malignancy. Images were
processed with CAD.
IMPRESSION: No mammographic evidence of malignancy. A result letter of this
screening mammogram will be mailed directly to the patient.

RECOMMENDATION:
Screening mammogram in one year. (Code:[id number])

BI-RADS CATEGORY  1: Negative.

## 2019-12-21 DIAGNOSIS — Z87891 Personal history of nicotine dependence: Secondary | ICD-10-CM | POA: Diagnosis not present

## 2019-12-21 DIAGNOSIS — R69 Illness, unspecified: Secondary | ICD-10-CM | POA: Diagnosis not present

## 2020-02-07 ENCOUNTER — Other Ambulatory Visit: Payer: Self-pay | Admitting: Neurology

## 2020-02-12 ENCOUNTER — Ambulatory Visit: Payer: Medicare HMO | Admitting: Adult Health

## 2020-02-19 ENCOUNTER — Other Ambulatory Visit: Payer: Self-pay | Admitting: Physician Assistant

## 2020-02-19 DIAGNOSIS — Z1231 Encounter for screening mammogram for malignant neoplasm of breast: Secondary | ICD-10-CM

## 2020-02-29 ENCOUNTER — Ambulatory Visit (INDEPENDENT_AMBULATORY_CARE_PROVIDER_SITE_OTHER): Payer: Medicare HMO | Admitting: Neurology

## 2020-02-29 ENCOUNTER — Encounter: Payer: Self-pay | Admitting: Neurology

## 2020-02-29 VITALS — BP 112/78 | HR 92 | Ht 64.0 in | Wt 151.0 lb

## 2020-02-29 DIAGNOSIS — R69 Illness, unspecified: Secondary | ICD-10-CM | POA: Diagnosis not present

## 2020-02-29 DIAGNOSIS — F0390 Unspecified dementia without behavioral disturbance: Secondary | ICD-10-CM | POA: Diagnosis not present

## 2020-02-29 MED ORDER — DONEPEZIL HCL 10 MG PO TABS: 10.0000 mg | ORAL_TABLET | Freq: Every day | ORAL | 3 refills | Status: DC

## 2020-02-29 NOTE — Progress Notes (Signed)
Subjective:    Patient ID: Lindsay Collier is a 64 y.o. female.  HPI     Interim history:   Lindsay Collier is a 64 year old right-handed woman with an underlying medical history of hyperlipidemia, hypertension, anxiety, depression, and smoking, who presents for follow-up consultation of her dementia. The patient is accompanied by her husband again today. I last saw her on 08/03/2018, at which time she was able to tolerate her dual medication for dementia.  She had gained some weight.  Appetite was fairly stable but her husband indicated that she likes to snack.  She saw Vaughan Browner, nurse practitioner in the interim on 02/08/2019 as well as 08/08/2019.  Her memory scores were 13 and 14 on those dates.   Today, 02/29/2020: She reports feeling stable, appetite is good, weight has been fluctuating a little bit.  She has had no falls.  Her husband reports that she drinks about 3 bottles or 2 bottles of water per day, 16 ounce size.  She does like to drink Pepsi but limits herself to about 2 glasses/day with ice.her husband is seeking power of attorney for her.  He is working with an Forensic psychologist for this.  The patient's allergies, current medications, family history, past medical history, past social history, past surgical history and problem list were reviewed and updated as appropriate.    Previously (copied from previous notes for reference):    I saw her on 01/26/2017, at which time she felt stable. Her MMSE was 24 at the time. She was advised to continue with generic Aricept 10 mg daily.   She saw Ward Givens, nurse practitioner in the interim on 07/29/2017 at which time her MMSE was 22 out of 30, as well as on 01/26/2018. At the last visit her MMSE was 13 out of 30. She was started on Namenda at the time.       I saw her on 07/27/2016, at which time she felt stable. She was not working since November 2017 and had also not been driving a car since then. She was still smoking, had no symptoms in the  realm of depression or anxiety. Her MMSE was 22 out of 30 at the time. I suggested we continue with generic Aricept. We talked about a healthy lifestyle, utilizing a neuropsychological evaluation down the road if financially possible and also about adding a second memory medication down the [place].     I saw her on 01/27/2016 at which time she reported no new issues, she was still working, she was still driving, she had no recent issues with driving, no episodes of confusion, no disorientation. She was still smoking, no issues with depression or anxiety, or husband reported that he had no additional concerns. Her memory scores for a little better than before. She was advised to continue with generic Aricept, stop smoking, increase her water intake, decrease her soda intake, we talked about her driving again and I asked her to proceed with formal neuropsychological testing as she had not been able to do it before.   I saw her on 07/29/15, at which time she reported doing well, no new issues, was still smoking about half pack per day, was trying to drink more water, less sodas. Memory scores were slightly better last time than the time before. I suggested we continue with donepezil 10 mg daily.    I saw her on 01/14/2015, at which time she reported doing well. She was trying to drink enough water. She was still drinking  some soda. She was active at work, on her feet a lot. She was driving about 20 minutes to work and from work. Her husband had observed her driving and had no new concerns. She was able to tolerate Aricept at 10 mg daily. Her mood was better. She felt that the Paxil had helped. Unfortunately, she was still smoking. Her MMSE was 21/30, CDT was 1/4, and AFT was 8/min at the time.    I saw her on 08/13/2014, at which time her husband reported that she was able to tolerate Aricept 5 mg daily. She had a recheck on her thyroid function through her primary care physician in September 2015. She was still  smoking. She was still drinking a lot of sodas. She was working and driving. She did get lost one time. She felt that her memory was stable. Her MMSE was 20 out of 30, clock drawing was 1 out of 4 and animal fluency was 7 at the time. I asked her to drink more water and stop smoking. I increased her Aricept to 10 mg. I asked her husband to observe her driving as a did not feel she was completely safe to drive. I talked to her and her husband extensively about her driving last time.   I first met her on 01/15/2014 at the request of her primary care provider, at which time her husband reported a several year history of memory loss including forgetfulness, easy distractibility, difficulty focusing and following instructions and difficulty following through at work. He felt that her memory loss started about 2-3 years ago, with gradual progression. At the time of her first visit I suggested we proceed with an MRI brain and formal cognitive testing and we also did some blood work. Her TSH was borderline low at 0.395. RPR was negative, vitamin B12 was elevated at 1600. We called and talked to her husband about her blood test results and advised him to pursue repeat thyroid function testing through her primary care physician. Her MMSE at that time was 18, clock drawing was 2 out of 4, animal fluency was 11. She was advised to increase her water intake, reduce her soda intake, and stop smoking. I did not start her on any new medication at the time. She had a brain MRI without contrast on 02/25/2014: Abnormal MRI brain showing  mild changes of chronic microvascular ischemia and  age disproportion generalized cerebral atrophy which appeared to have progressed compared with previous scan from 2012. In addition, personally reviewed the images through the PACS system. I also talked to her husband on the phone on 03/12/2014 about her MRI results and suggested we go ahead and start her on Aricept 5 mg strength. I talked to him  about potential side effects. She was seen for a diagnostic interview by Dr. Valentina Shaggy on 05/07/2014 and I reviewed the report. Unfortunately, they did not pursue neuropsychological testing secondary to the cost factor as I understand.   She drives, and has not gotten lost, no issues dressing, cooking.   Symptom onset was gradual and are now gradually worsening. She primarily has been having difficulty with short-term memory such as forgetfulness, misplacing things, asking the same question again and forgetting dates and events. Familiar faces are easily recognized. She had a CTH wo contrast on 10/22/10 which was reported as normal, but MRA head showed mild cerebellar atrophy.   She reports no recurrent headaches. There is family history of memory loss, perhaps, in her younger brother and maybe her parents,  but she is not sure.    There is no report of Auditory Hallucinations and Visual Hallucinations and there are no delusions, such as paranoia.   She has not been on any dementia medications. She had some residual symptoms of depression and anxiety and had an increase in her Paxil to 30 mg. She denies suicidal ideations or homicidal ideations.   The patient denies prior TIA or stroke symptoms, such as sudden onset of one sided weakness, numbness, tingling, slurring of speech or droopy face, hearing loss, tinnitus, diplopia or visual field cut or monocular loss of vision, and denies recurrent headaches.   Of note, the patient reports snoring, and there is no report of witnessed apneas or choking sensations while asleep. She drinks more than 3 Pepsis per day. She smokes about 1/2 ppd.   Her Past Medical History Is Significant For: Past Medical History:  Diagnosis Date  . Anemia   . Anxiety   . Hyperlipidemia   . Hypertension   . Smoker     Her Past Surgical History Is Significant For: Past Surgical History:  Procedure Laterality Date  . ABDOMINAL HYSTERECTOMY     total  . CESAREAN SECTION    .  COLONOSCOPY     2012  . TONSILLECTOMY AND ADENOIDECTOMY      Her Family History Is Significant For: Family History  Problem Relation Age of Onset  . Hypertension Father   . Heart disease Father   . Cancer Father        Prostate  . Hypertension Mother   . Asthma Son   . Diabetes Brother     Her Social History Is Significant For: Social History   Socioeconomic History  . Marital status: Married    Spouse name: Lindsay Collier  . Number of children: 1  . Years of education: 12+  . Highest education level: Not on file  Occupational History  . 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 Occupation: unemployed    Comment: due to memory loss  Tobacco Use  . Smoking status: Current Every Day Smoker    Packs/day: 0.45    Types: Cigarettes  . Smokeless tobacco: Never Used  . Tobacco comment: cutting back,currently smoking 1/2 pk daily  Substance and Sexual Activity  . Alcohol use: No    Alcohol/week: 0.0 standard drinks  . Drug use: No  . Sexual activity: Yes    Partners: Male    Birth control/protection: Surgical  Other Topics Concern  . Not on file  Social History Narrative   Lives with her husband and their son. Patient is right handed   Social Determinants of Health   Financial Resource Strain:   . Difficulty of Paying Living Expenses: Not on file  Food Insecurity:   . Worried About Charity fundraiser in the Last Year: Not on file  . Ran Out of Food in the Last Year: Not on file  Transportation Needs:   . Lack of Transportation (Medical): Not on file  . Lack of Transportation (Non-Medical): Not on file  Physical Activity:   . Days of Exercise per Week: Not on file  . Minutes of Exercise per Session: Not on file  Stress:   . Feeling of Stress : Not on file  Social Connections:   . Frequency of Communication with Friends and Family: Not on file  . Frequency of Social Gatherings with Friends and Family: Not on file   .  Attends Religious Services: Not on file  . Active Member of Clubs or Organizations: Not on file  . Attends Archivist Meetings: Not on file  . Marital Status: Not on file    Her Allergies Are:  Allergies  Allergen Reactions  . Codeine     Excessive sweating  :   Her Current Medications Are:  Outpatient Encounter Medications as of 02/29/2020  Medication Sig  . ALPRAZolam (XANAX) 0.25 MG tablet TAKE 1 TABLET BY MOUTH TWICE A DAY AS NEEDED  . atorvastatin (LIPITOR) 20 MG tablet Take 20 mg by mouth daily.  Marland Kitchen donepezil (ARICEPT) 10 MG tablet TAKE 1 TABLET BY MOUTH EVERYDAY AT BEDTIME  . ferrous sulfate 325 (65 FE) MG tablet Take 325 mg by mouth daily with breakfast.  . fish oil-omega-3 fatty acids 1000 MG capsule Take 2 g by mouth daily.  Marland Kitchen lisinopril-hydrochlorothiazide (PRINZIDE,ZESTORETIC) 20-25 MG tablet Take 1 tablet by mouth daily.  . memantine (NAMENDA) 10 MG tablet TAKE 1 TABLET BY MOUTH TWICE A DAY  . Multiple Vitamin (MULTIVITAMIN) tablet Take 1 tablet by mouth daily.  Marland Kitchen OVER THE COUNTER MEDICATION Magnesium 241m  . OVER THE COUNTER MEDICATION Vit D-3 1000iu  . OVER THE COUNTER MEDICATION Aloe Vera juice once daily  . PARoxetine (PAXIL) 30 MG tablet Take 1 tablet (30 mg total) by mouth daily. TAKE 1 TABLET (30 MG TOTAL) BY MOUTH DAILY.  . [DISCONTINUED] simvastatin (ZOCOR) 20 MG tablet Take 1 tablet (20 mg total) by mouth daily. (Patient not taking: Reported on 02/29/2020)   No facility-administered encounter medications on file as of 02/29/2020.  :  Review of Systems:  Out of a complete 14 point review of systems, all are reviewed and negative with the exception of these symptoms as listed below:  Review of Systems  Neurological:       Here for 6 month f/u. Reports she has been doing well. Denies any falls.     Objective:  Neurological Exam  Physical Exam Physical Examination:   Vitals:   02/29/20 0842  BP: 112/78  Pulse: 92  SpO2: 97%   General  Examination: The patient is a very pleasant 64y.o. female in no acute distress. She appears well-developed and well-nourished and well groomed.  She is in good spirits, conversant.  HEENT:Normocephalic, atraumatic, pupils are equal, round and reactive to light, tracking is a little difficult for her.  Corrective eyeglasses in place, face is symmetric, no nystagmus, no obvious hearing loss.  Speech is clear, no dysarthria, no hypophonia.  She has no significant head or neck tremor today.  Neck is supple, no carotid bruits.  Airway examination reveals mild mouth dryness, tongue protrudes centrally in palate elevates symmetrically.   Chest:Clear to auscultation without wheezing, rhonchi or crackles noted.  Heart:S1+S2+0, regular and normal without murmurs, rubs or gallops noted.   Abdomen:Soft, non-tender and non-distended with normal bowel sounds appreciated on auscultation.  Extremities:There is nopitting edema in the distal lower extremities bilaterally.   Skin: Warm and dry without trophic changes noted.  Musculoskeletal: exam reveals no obvious joint deformities, tenderness or joint swelling or erythema.   Neurologically:  Mental status: The patient is awake, alert and oriented to self, county, place and season. Herimmediate and remote memory, attention, language skills and fund of knowledge are impaired. Speech is clear with normal prosody and enunciation. Thought process is linear. Mood is normaland affect is normal.   On 02/29/2020: MMSE: 13/30, declined clock drawing, AFT: 8/min.  On 08/03/2018: MMSE:  19/30, CDT: 1/4, AFT: 13/min.  On 01/26/18: MMSE: 13/30.   On8/28/2018: MMSE: 24/30, CDT: 1/4, AFT 14/min.   On2/26/2018: MMSE: 22/30, CDT: 1/4, AFT: 12/min.   On 01/27/2016: MMSE: 24/30, CDT: 1/4, AFT: 16/min.  On 07/29/2015: MMSE: 27/30, CDT: 1/4, AFT: 17/min.  On 01/14/2015: MMSE 21/30, CDT: 1/4, AFT is 8/min.  On 08/13/2014: MMSE 20/30, CDT:1/4,  AFT:7/min.   On 01/15/14: Her MMSE (Mini-Mental state exam) score is 18/30. CDT (Clock Drawing Test) score is 2/4. AFT (Animal Fluency Test) score is 11.   Cranial nerves II - XII are as described above under HEENT exam.  Motor exam: Normal bulk, strength and tone is noted. There is no tremor, she has a mild postural and action tremor in both upper extremities, stable.  Reflexes are about 1+.  Fine motor skills and coordination:There is slight difficulty globally.  She does have slight difficulty following just verbal commands but it is easier for her to mimic finger taps and foot taps.  Cerebellar testing: No dysmetria or intention tremor.  No ataxia. Sensory exam: intact to light touch in upperand lower extremities.  Gait, station and balance: Shestands easily.  Romberg is negative.  No veering to one side is noted. No leaning to one side is noted. Posture is age-appropriate and stance is narrow based. Gait shows normalstride length and normalpace. No problems turning are noted.   Assessment and Plan:    In summary, TAYLI BUCH a very pleasant 64 year old femalewith a history of Hyperlipidemia, hypertension, depression, anxiety and smoking, who presents for follow-up consultation of her dementia without behavioral disturbance. She has risk factorsfor vascular dementia but given her younger age when she started with memory issues, early onset Alzheimer's disease is likely. She denies any significant family history of dementia. Memory scores had initially improved on medication, but her memory scores have declined over time.  She has been on Namenda since August 2019. She has been on Aricept for the past several years.  She tolerates both medications thankfully. She no longer works. Workup in the past with brain MRI showed advanced atrophy, mild white matter changes in 2015 and she also had a brain MRI before then and there was progression of the atrophy. She was not able to pursue  neuropsychological evaluation due to cost in the past but I would like to see about getting evaluation done at this time.  She may be covered through her insurance and I would like to look into it.  I think it would be helpful in order for her husband to also pursue power of attorney.  I made a referral, they are agreeable. We talked about the importance of healthy lifestyle, good nutrition, good hydration and getting proper rest. She is advised to monitor her weight. She is encouraged to continue with donepezil 10 mg once daily and Namenda generic twice daily. I renewed the prescription for the donepezil prescription, she had an up-to-date prescription for the memantine. She is advised to follow-up in 6 months routinely, she can see Ward Givens. NP, again. I answered all their questions today and the patient and her husband were in agreement. I spent 30 minutes in total face-to-face time and in reviewing records during pre-charting, more than 50% of which was spent in counseling and coordination of care, reviewing test results, reviewing medications and treatment regimen and/or in discussing or reviewing the diagnosis of dementia, the prognosis and treatment options. Pertinent laboratory and imaging test results that were available during this visit with  the patient were reviewed by me and considered in my medical decision making (see chart for details).

## 2020-02-29 NOTE — Patient Instructions (Signed)
As discussed, we will continue with your medication for memory loss and continue to monitor your symptoms and your memory scores.  I have placed a referral for you to see a neuropsychologist for more in-depth memory and cognitive evaluation.  I think it would be really helpful to have this done.  We will call you with their assessment.  Please follow-up routinely to see [name] in 6 months.

## 2020-03-25 ENCOUNTER — Other Ambulatory Visit: Payer: Self-pay

## 2020-03-25 ENCOUNTER — Ambulatory Visit
Admission: RE | Admit: 2020-03-25 | Discharge: 2020-03-25 | Disposition: A | Discharge status: Home / Self Care | Payer: Medicare HMO | Source: Ambulatory Visit | Attending: Physician Assistant | Admitting: Physician Assistant

## 2020-03-25 DIAGNOSIS — Z1231 Encounter for screening mammogram for malignant neoplasm of breast: Secondary | ICD-10-CM | POA: Diagnosis not present

## 2020-05-08 DIAGNOSIS — H25013 Cortical age-related cataract, bilateral: Secondary | ICD-10-CM | POA: Diagnosis not present

## 2020-05-08 DIAGNOSIS — Z720 Tobacco use: Secondary | ICD-10-CM | POA: Diagnosis not present

## 2020-05-08 DIAGNOSIS — I10 Essential (primary) hypertension: Secondary | ICD-10-CM | POA: Diagnosis not present

## 2020-05-08 DIAGNOSIS — R69 Illness, unspecified: Secondary | ICD-10-CM | POA: Diagnosis not present

## 2020-05-08 DIAGNOSIS — Z0000 Encounter for general adult medical examination without abnormal findings: Secondary | ICD-10-CM | POA: Diagnosis not present

## 2020-05-08 DIAGNOSIS — E7800 Pure hypercholesterolemia, unspecified: Secondary | ICD-10-CM | POA: Diagnosis not present

## 2020-05-08 DIAGNOSIS — Z23 Encounter for immunization: Secondary | ICD-10-CM | POA: Diagnosis not present

## 2020-08-09 ENCOUNTER — Other Ambulatory Visit: Payer: Self-pay | Admitting: Adult Health

## 2020-09-02 ENCOUNTER — Encounter: Payer: Self-pay | Admitting: Adult Health

## 2020-09-02 ENCOUNTER — Other Ambulatory Visit: Payer: Self-pay

## 2020-09-02 ENCOUNTER — Ambulatory Visit: Payer: Medicare HMO | Admitting: Adult Health

## 2020-09-02 VITALS — BP 124/82 | HR 82 | Ht 64.0 in | Wt 154.0 lb

## 2020-09-02 DIAGNOSIS — F0390 Unspecified dementia without behavioral disturbance: Secondary | ICD-10-CM

## 2020-09-02 MED ORDER — DONEPEZIL HCL 10 MG PO TABS: 10.0000 mg | ORAL_TABLET | Freq: Every day | ORAL | 3 refills | Status: DC

## 2020-09-02 MED ORDER — MEMANTINE HCL 10 MG PO TABS: 10.0000 mg | ORAL_TABLET | Freq: Two times a day (BID) | ORAL | 3 refills | Status: DC

## 2020-09-02 NOTE — Progress Notes (Addendum)
PATIENT: Lindsay Collier DOB: 07-10-1955  REASON FOR VISIT: follow up HISTORY FROM: patient  HISTORY OF PRESENT ILLNESS: Today 09/02/20:  [name] is a 65 year old female with a history of dementia.  She returns today for follow-up.  She is here today with her husband.  He states that some days her memory seems a little worse.  She is able to complete ADLs but requires prompting.  Denies any trouble sleeping.  No change in mood or behavior.  Husband reports that she has trouble following directions.  He manages her medications and appointments.  She returns today for an evaluation.   08/08/19: [name] is a 65 year old female with a history of dementia.  She returns today for follow-up.  Remains on Namenda and Aricept.  Feels that her memory has remained stable.  Her husband helps her with the finances.  She also requires some assistance and supervision with ADLs.  She no longer operates a motor vehicle.  Her husband manages her medications and appointments.  She no longer does any cooking.  No changes with her mood or behavior.  HISTORY 02/08/19:  [name] is a 65 year old female with a history of dementia.  She returns today for follow-up.  She is with her husband.  She remains on Namenda and Aricept.  Tolerating both medications well.  She is able to complete ADLs with minimal assistance.  She does not operate a motor vehicle.  She no longer prepares meals.  No change in mood or behavior.  Reports that she sleeps well.  She returns today for evaluation.   REVIEW OF SYSTEMS: Out of a complete 14 system review of symptoms, the patient complains only of the following symptoms, and all other reviewed systems are negative.  See HPI  ALLERGIES: Allergies  Allergen Reactions  . Codeine     Excessive sweating    HOME MEDICATIONS: Outpatient Medications Prior to Visit  Medication Sig Dispense Refill  . ALPRAZolam (XANAX) 0.25 MG tablet TAKE 1 TABLET BY MOUTH TWICE A DAY AS  NEEDED 60 tablet 0  . atorvastatin (LIPITOR) 20 MG tablet Take 20 mg by mouth daily.    Marland Kitchen donepezil (ARICEPT) 10 MG tablet Take 1 tablet (10 mg total) by mouth at bedtime. 90 tablet 3  . ferrous sulfate 325 (65 FE) MG tablet Take 325 mg by mouth daily with breakfast.    . fish oil-omega-3 fatty acids 1000 MG capsule Take 2 g by mouth daily.    Marland Kitchen lisinopril-hydrochlorothiazide (PRINZIDE,ZESTORETIC) 20-25 MG tablet Take 1 tablet by mouth daily. 90 tablet 3  . memantine (NAMENDA) 10 MG tablet TAKE 1 TABLET BY MOUTH TWICE A DAY 180 tablet 3  . Multiple Vitamin (MULTIVITAMIN) tablet Take 1 tablet by mouth daily.    Marland Kitchen OVER THE COUNTER MEDICATION Magnesium 250mg     . OVER THE COUNTER MEDICATION Vit D-3 1000iu    . OVER THE COUNTER MEDICATION Aloe Vera juice once daily    . PARoxetine (PAXIL) 30 MG tablet Take 1 tablet (30 mg total) by mouth daily. TAKE 1 TABLET (30 MG TOTAL) BY MOUTH DAILY. 90 tablet 3   No facility-administered medications prior to visit.    PAST MEDICAL HISTORY: Past Medical History:  Diagnosis Date  . Anemia   . Anxiety   . Hyperlipidemia   . Hypertension   . Smoker     PAST SURGICAL HISTORY: Past Surgical History:  Procedure Laterality Date  . ABDOMINAL HYSTERECTOMY     total  .  CESAREAN SECTION    . COLONOSCOPY     2012  . TONSILLECTOMY AND ADENOIDECTOMY      FAMILY HISTORY: Family History  Problem Relation Age of Onset  . Hypertension Father   . Heart disease Father   . Cancer Father        Prostate  . Hypertension Mother   . Asthma Son   . Diabetes Brother     SOCIAL HISTORY: Social History   Socioeconomic History  . Marital status: Married    Spouse name: [name]  . Number of children: 1  . Years of education: 12+  . Highest education level: Not on file  Occupational History  . Occupation:  former Production manager: [employer]    Comment: [employer]  . Occupation: unemployed    Comment: due to memory loss  Tobacco Use   . Smoking status: Current Every Day Smoker    Packs/day: 0.45    Types: Cigarettes  . Smokeless tobacco: Never Used  . Tobacco comment: cutting back,currently smoking 1/2 pk daily  Substance and Sexual Activity  . Alcohol use: No    Alcohol/week: 0.0 standard drinks  . Drug use: No  . Sexual activity: Yes    Partners: Male    Birth control/protection: Surgical  Other Topics Concern  . Not on file  Social History Narrative   Lives with her husband and their son. Patient is right handed   Social Determinants of Health   Financial Resource Strain: Not on file  Food Insecurity: Not on file  Transportation Needs: Not on file  Physical Activity: Not on file  Stress: Not on file  Social Connections: Not on file  Intimate Partner Violence: Not on file      PHYSICAL EXAM  Vitals:   09/02/20 0826  BP: 124/82  Pulse: 82  SpO2: 94%  Weight: 154 lb (69.9 kg)  Height: 5\' 4"  (1.626 m)   Body mass index is 26.43 kg/m.   MMSE - Mini Mental State Exam 09/02/2020 02/29/2020 08/08/2019  Not completed: - (No Data) (No Data)  Orientation to time 0 0 1  Orientation to Place 5 4 5   Registration 3 3 3   Attention/ Calculation 0 0 0  Recall 0 0 0  Language- name 2 objects 2 2 2   Language- repeat 1 1 1   Language- follow 3 step command 3 2 1   Language- follow 3 step command-comments - used left hand she grabbed the paper with her left hand and held the paper in the air  Language- read & follow direction 1 1 1   Write a sentence 0 0 0  Copy design 0 0 0  Total score 15 13 14      Generalized: Well developed, in no acute distress   Neurological examination  Mentation: Alert oriented to person.. Follows all commands intermittently, speech and language fluent Cranial nerve II-XII: Pupils were equal round reactive to light. Extraocular movements were full, visual field were full on confrontational test. Head turning and shoulder shrug  were normal and symmetric. Motor: The motor testing  reveals 5 over 5 strength of all 4 extremities. Good symmetric motor tone is noted throughout.  Sensory: Sensory testing is intact to soft touch on all 4 extremities. No evidence of extinction is noted.  Coordination: Cerebellar testing reveals good finger-nose-finger and heel-to-shin bilaterally.  Gait and station: Gait is normal. Reflexes: Deep tendon reflexes are symmetric and normal bilaterally.   DIAGNOSTIC DATA (LABS, IMAGING, TESTING) -  I reviewed patient records, labs, notes, testing and imaging myself where available.  Lab Results  Component Value Date   WBC 8.6 01/26/2018   HGB 14.2 01/26/2018   HCT 43.7 01/26/2018   MCV 100 (H) 01/26/2018   PLT 362 01/26/2018      Component Value Date/Time   NA 139 01/26/2018 0809   K 4.9 01/26/2018 0809   CL 101 01/26/2018 0809   CO2 22 01/26/2018 0809   GLUCOSE 101 (H) 01/26/2018 0809   GLUCOSE 85 04/06/2016 0912   BUN 11 01/26/2018 0809   CREATININE 0.79 01/26/2018 0809   CREATININE 0.78 04/06/2016 0912   CALCIUM 10.4 (H) 01/26/2018 0809   PROT 7.1 01/26/2018 0809   ALBUMIN 4.4 01/26/2018 0809   AST 30 01/26/2018 0809   ALT 30 01/26/2018 0809   ALKPHOS 130 (H) 01/26/2018 0809   BILITOT <0.2 01/26/2018 0809   GFRNONAA 80 01/26/2018 0809   GFRAA 93 01/26/2018 0809   Lab Results  Component Value Date   CHOL 179 05/21/2017   HDL 45 05/21/2017   LDLCALC 106 (H) 05/21/2017   TRIG 142 05/21/2017   CHOLHDL 4.0 05/21/2017   Lab Results  Component Value Date   HGBA1C 5.1 01/15/2014   Lab Results  Component Value Date   VITAMINB12 1,600 (H) 01/15/2014   Lab Results  Component Value Date   TSH 1.140 11/20/2016      ASSESSMENT AND PLAN 65 y.o. year old female  has a past medical history of Anemia, Anxiety, Hyperlipidemia, Hypertension, and Smoker. here with:  1.  Memory disturbance  -MMSE is stable 15 out of 30 -Continue to monitor symptoms -Continue Aricept 10 mg at bedtime -Continue Namenda 10 mg twice a  day -Follow-up in 6 months or sooner if needed  I spent 30 minutes of face-to-face and non-face-to-face time with patient.  This included previsit chart review, lab review, study review, order entry, electronic health record documentation, patient education.   Butch Penny, MSN, NP-C 09/02/2020, 8:43 AM [facility] [address], [address] [place], [place] [address] [phone]   I reviewed the above note and documentation by the Nurse Practitioner and agree with the history, exam, assessment and plan as outlined above. I was available for consultation. Huston Foley, MD, PhD [facility])

## 2020-09-02 NOTE — Patient Instructions (Signed)
Your Plan:  Continue Aricept and Namenda If your symptoms worsen or you develop new symptoms please let us know.   Thank you for coming to see us at [facility]. I hope we have been able to provide you high quality care today.  You may receive a patient satisfaction survey over the next few weeks. We would appreciate your feedback and comments so that we may continue to improve ourselves and the health of our patients.  

## 2021-02-14 ENCOUNTER — Other Ambulatory Visit: Payer: Self-pay | Admitting: Physician Assistant

## 2021-02-14 DIAGNOSIS — Z1231 Encounter for screening mammogram for malignant neoplasm of breast: Secondary | ICD-10-CM

## 2021-03-06 ENCOUNTER — Ambulatory Visit: Payer: Medicare HMO | Admitting: Adult Health

## 2021-03-06 ENCOUNTER — Encounter: Payer: Self-pay | Admitting: Adult Health

## 2021-03-26 ENCOUNTER — Ambulatory Visit
Admission: RE | Admit: 2021-03-26 | Discharge: 2021-03-26 | Disposition: A | Discharge status: Home / Self Care | Payer: Medicare HMO | Source: Ambulatory Visit | Attending: Physician Assistant | Admitting: Physician Assistant

## 2021-03-26 ENCOUNTER — Other Ambulatory Visit: Payer: Self-pay

## 2021-03-26 DIAGNOSIS — Z1231 Encounter for screening mammogram for malignant neoplasm of breast: Secondary | ICD-10-CM

## 2021-09-03 ENCOUNTER — Encounter: Payer: Self-pay | Admitting: Adult Health

## 2021-09-03 ENCOUNTER — Ambulatory Visit: Payer: Medicare HMO | Admitting: Adult Health

## 2021-09-03 VITALS — BP 116/75 | HR 84 | Ht 64.0 in | Wt 150.4 lb

## 2021-09-03 DIAGNOSIS — F02C0 Dementia in other diseases classified elsewhere, severe, without behavioral disturbance, psychotic disturbance, mood disturbance, and anxiety: Secondary | ICD-10-CM

## 2021-09-03 DIAGNOSIS — G309 Alzheimer's disease, unspecified: Secondary | ICD-10-CM

## 2021-09-03 MED ORDER — DONEPEZIL HCL 10 MG PO TABS: 10.0000 mg | ORAL_TABLET | Freq: Every day | ORAL | 3 refills | Status: AC

## 2021-09-03 MED ORDER — MEMANTINE HCL 10 MG PO TABS: 10.0000 mg | ORAL_TABLET | Freq: Two times a day (BID) | ORAL | 3 refills | Status: AC

## 2021-09-03 NOTE — Patient Instructions (Signed)
Your Plan: ? ?Continue Aricept and Namenda ?Keep regular Followup with PCP ?If your symptoms worsen or you develop new symptoms please let us know.  ? ?Thank you for coming to see [place] at [facility]. I hope we have been able to provide you high quality care today. ? ?You may receive a patient satisfaction survey over the next few weeks. We would appreciate your feedback and comments so that we may continue to improve ourselves and the health of our patients. ? ? ?

## 2021-09-03 NOTE — Progress Notes (Signed)
? ? ?PATIENT: Lindsay Collier ?DOB: [birth year] ? ?REASON FOR VISIT: follow up ?HISTORY FROM: patient ? ? ?Chief Complaint  ?Patient presents with  ? Follow-up  ?  Pt in 20   with husband  pt is here for dementia . Husband states patient memory is day by day   ? ? ? ?HISTORY OF PRESENT ILLNESS: ?Today 09/03/21: ? ?[name] is a 66 year old female with a history of dementia. She retruns today for follow-up. Continue on Namenda and Aricept. Here today with her husband.  Continue to lives at home with husband. Needs assistance and prompting with ADLs. Reports that she is eating well. Sleeping ok. Denies any changes in mood and behavior. Husband manages her appts and medications. Sees her PCP twice a year. ? ?09/02/20: [name] is a 66 year old female with a history of dementia.  She returns today for follow-up.  She is here today with her husband.  He states that some days her memory seems a little worse.  She is able to complete ADLs but requires prompting.  Denies any trouble sleeping.  No change in mood or behavior.  Husband reports that she has trouble following directions.  He manages her medications and appointments.  She returns today for an evaluation. ? ? ?08/08/19: ?[name] is a 66 year old female with a history of dementia.  She returns today for follow-up.  Remains on Namenda and Aricept.  Feels that her memory has remained stable.  Her husband helps her with the finances.  She also requires some assistance and supervision with ADLs.  She no longer operates a motor vehicle.  Her husband manages her medications and appointments.  She no longer does any cooking.  No changes with her mood or behavior. ? ?HISTORY 02/08/19: ?  ?[name] is a 66 year old female with a history of dementia.  She returns today for follow-up.  She is with her husband.  She remains on Namenda and Aricept.  Tolerating both medications well.  She is able to complete ADLs with minimal assistance.  She does not operate a motor vehicle.   She no longer prepares meals.  No change in mood or behavior.  Reports that she sleeps well.  She returns today for evaluation. ?  ? ?REVIEW OF SYSTEMS: Out of a complete 14 system review of symptoms, the patient complains only of the following symptoms, and all other reviewed systems are negative. ? ?See HPI ? ?ALLERGIES: ?Allergies  ?Allergen Reactions  ? Codeine   ?  Excessive sweating  ? ? ?HOME MEDICATIONS: ?Outpatient Medications Prior to Visit  ?Medication Sig Dispense Refill  ? ALPRAZolam (XANAX) 0.25 MG tablet TAKE 1 TABLET BY MOUTH TWICE A DAY AS NEEDED 60 tablet 0  ? atorvastatin (LIPITOR) 20 MG tablet Take 20 mg by mouth daily.    ? calcium carbonate (OSCAL) 1500 (600 Ca) MG TABS tablet Take by mouth.    ? donepezil (ARICEPT) 10 MG tablet Take 1 tablet (10 mg total) by mouth at bedtime. 90 tablet 3  ? ferrous sulfate 325 (65 FE) MG tablet Take 325 mg by mouth daily with breakfast.    ? fish oil-omega-3 fatty acids 1000 MG capsule Take 2 g by mouth daily.    ? lisinopril-hydrochlorothiazide (PRINZIDE,ZESTORETIC) 20-25 MG tablet Take 1 tablet by mouth daily. 90 tablet 3  ? memantine (NAMENDA) 10 MG tablet Take 1 tablet (10 mg total) by mouth 2 (two) times daily. 180 tablet 3  ? Multiple Vitamin (MULTIVITAMIN) tablet Take  1 tablet by mouth daily.    ? OVER THE COUNTER MEDICATION Magnesium 250mg     ? OVER THE COUNTER MEDICATION Vit D-3 1000iu    ? OVER THE COUNTER MEDICATION Aloe Vera juice once daily    ? PARoxetine (PAXIL) 30 MG tablet Take 1 tablet (30 mg total) by mouth daily. TAKE 1 TABLET (30 MG TOTAL) BY MOUTH DAILY. 90 tablet 3  ? ?No facility-administered medications prior to visit.  ? ? ?PAST MEDICAL HISTORY: ?Past Medical History:  ?Diagnosis Date  ? Anemia   ? Anxiety   ? Hyperlipidemia   ? Hypertension   ? Smoker   ? ? ?PAST SURGICAL HISTORY: ?Past Surgical History:  ?Procedure Laterality Date  ? ABDOMINAL HYSTERECTOMY    ? total  ? CESAREAN SECTION    ? COLONOSCOPY    ? 2012  ? TONSILLECTOMY  AND ADENOIDECTOMY    ? ? ?FAMILY HISTORY: ?Family History  ?Problem Relation Age of Onset  ? Hypertension Mother   ? Hypertension Father   ? Heart disease Father   ? Cancer Father   ?     Prostate  ? Diabetes Brother   ? Asthma Son   ? Alzheimer's disease Neg Hx   ? Dementia Neg Hx   ? ? ?SOCIAL HISTORY: ?Social History  ? ?Socioeconomic History  ? Marital status: Married  ?  Spouse name: Aurther Lofterry  ? Number of children: 1  ? Years of education: 12+  ? Highest education level: Not on file  ?Occupational History  ? Occupation:  former Dietary Aid  ?  Employer: [employer]  ?  Comment: [employer]  ? Occupation: unemployed  ?  Comment: due to memory loss  ?Tobacco Use  ? Smoking status: Every Day  ?  Packs/day: 0.25  ?  Types: Cigarettes  ? Smokeless tobacco: Never  ? Tobacco comments:  ?  cutting back,currently smoking 1/2 pk daily  ?Substance and Sexual Activity  ? Alcohol use: No  ?  Alcohol/week: 0.0 standard drinks  ? Drug use: No  ? Sexual activity: Yes  ?  Partners: Male  ?  Birth control/protection: Surgical  ?Other Topics Concern  ? Not on file  ?Social History Narrative  ? Lives with her husband and their son. Patient is right handed  ? ?Social Determinants of Health  ? ?Financial Resource Strain: Not on file  ?Food Insecurity: Not on file  ?Transportation Needs: Not on file  ?Physical Activity: Not on file  ?Stress: Not on file  ?Social Connections: Not on file  ?Intimate Partner Violence: Not on file  ? ? ? ? ?PHYSICAL EXAM ? ?Vitals:  ? 09/03/21 0830  ?BP: 116/75  ?Pulse: 84  ?Weight: 150 lb 6.4 oz (68.2 kg)  ?Height: 5\' 4"  (1.626 m)  ? ?Body mass index is 25.82 kg/m?.  ? ? ?  09/03/2021  ?  8:34 AM 09/02/2020  ?  8:35 AM 02/29/2020  ?  8:45 AM  ?MMSE - Mini Mental State Exam  ?Orientation to time 0 0 0  ?Orientation to Place 3 5 4   ?Registration 3 3 3   ?Attention/ Calculation 0 0 0  ?Recall 0 0 0  ?Language- name 2 objects 0 2 2  ?Language- repeat 1 1 1   ?Language- follow 3 step command 0 3 2   ?Language- follow 3 step command-comments   used left hand  ?Language- read & follow direction 0 1 1  ?Write a sentence 0 0 0  ?Copy design  0 0 0  ?Total score 7 15 13   ? ? ? ?Generalized: Well developed, in no acute distress  ? ?Neurological examination  ?Mentation: Alert oriented to person. Follows all commands intermittently, speech limited ?Cranial nerve II-XII: Pupils were equal round reactive to light. Extraocular movements were full, visual field were full on confrontational test. Head turning and shoulder shrug  were normal and symmetric. ?Motor: The motor testing reveals 5 over 5 strength of all 4 extremities. Good symmetric motor tone is noted throughout.  ?Sensory: Sensory testing is intact to soft touch on all 4 extremities. No evidence of extinction is noted.  ?Coordination: Cerebellar testing reveals difficulty with  finger-nose-finger and heel-to-shin bilaterally d/t ability to understand and process directions ?Gait and station: Gait is normal. ? ? ?DIAGNOSTIC DATA (LABS, IMAGING, TESTING) ?- I reviewed patient records, labs, notes, testing and imaging myself where available. ? ?Lab Results  ?Component Value Date  ? WBC 8.6 01/26/2018  ? HGB 14.2 01/26/2018  ? HCT 43.7 01/26/2018  ? MCV 100 (H) 01/26/2018  ? PLT 362 01/26/2018  ? ?   ?Component Value Date/Time  ? NA 139 01/26/2018 0809  ? K 4.9 01/26/2018 0809  ? CL 101 01/26/2018 0809  ? CO2 22 01/26/2018 0809  ? GLUCOSE 101 (H) 01/26/2018 0809  ? GLUCOSE 85 04/06/2016 0912  ? BUN 11 01/26/2018 0809  ? CREATININE 0.79 01/26/2018 0809  ? CREATININE 0.78 04/06/2016 0912  ? CALCIUM 10.4 (H) 01/26/2018 0809  ? PROT 7.1 01/26/2018 0809  ? ALBUMIN 4.4 01/26/2018 0809  ? AST 30 01/26/2018 0809  ? ALT 30 01/26/2018 0809  ? ALKPHOS 130 (H) 01/26/2018 0809  ? BILITOT <0.2 01/26/2018 0809  ? GFRNONAA 80 01/26/2018 0809  ? GFRAA 93 01/26/2018 0809  ? ?Lab Results  ?Component Value Date  ? CHOL 179 05/21/2017  ? HDL 45 05/21/2017  ? LDLCALC 106 (H) 05/21/2017   ? TRIG 142 05/21/2017  ? CHOLHDL 4.0 05/21/2017  ? ?Lab Results  ?Component Value Date  ? HGBA1C 5.1 01/15/2014  ? ?Lab Results  ?Component Value Date  ? VITAMINB12 1,600 (H) 01/15/2014  ? ?Lab Results  ?Comp

## 2022-02-19 ENCOUNTER — Other Ambulatory Visit: Payer: Self-pay | Admitting: Physician Assistant

## 2022-02-19 DIAGNOSIS — Z1231 Encounter for screening mammogram for malignant neoplasm of breast: Secondary | ICD-10-CM

## 2022-03-27 ENCOUNTER — Ambulatory Visit
Admission: RE | Admit: 2022-03-27 | Discharge: 2022-03-27 | Disposition: A | Discharge status: Home / Self Care | Payer: Medicare HMO | Source: Ambulatory Visit | Attending: Physician Assistant | Admitting: Physician Assistant

## 2022-03-27 DIAGNOSIS — Z1231 Encounter for screening mammogram for malignant neoplasm of breast: Secondary | ICD-10-CM

## 2022-03-31 ENCOUNTER — Other Ambulatory Visit: Payer: Self-pay | Admitting: Physician Assistant

## 2022-03-31 DIAGNOSIS — R928 Other abnormal and inconclusive findings on diagnostic imaging of breast: Secondary | ICD-10-CM

## 2022-04-14 ENCOUNTER — Ambulatory Visit
Admission: RE | Admit: 2022-04-14 | Discharge: 2022-04-14 | Disposition: A | Discharge status: Home / Self Care | Payer: Medicare HMO | Source: Ambulatory Visit | Attending: Physician Assistant | Admitting: Physician Assistant

## 2022-04-14 ENCOUNTER — Other Ambulatory Visit: Payer: Self-pay | Admitting: Physician Assistant

## 2022-04-14 DIAGNOSIS — N6310 Unspecified lump in the right breast, unspecified quadrant: Secondary | ICD-10-CM

## 2022-04-14 DIAGNOSIS — R928 Other abnormal and inconclusive findings on diagnostic imaging of breast: Secondary | ICD-10-CM

## 2022-10-13 ENCOUNTER — Other Ambulatory Visit: Payer: Self-pay | Admitting: Adult Health

## 2022-10-16 ENCOUNTER — Ambulatory Visit
Admission: RE | Admit: 2022-10-16 | Discharge: 2022-10-16 | Disposition: A | Discharge status: Home / Self Care | Payer: Medicare HMO | Source: Ambulatory Visit | Attending: Physician Assistant | Admitting: Physician Assistant

## 2022-10-16 DIAGNOSIS — N6310 Unspecified lump in the right breast, unspecified quadrant: Secondary | ICD-10-CM

## 2022-10-27 ENCOUNTER — Other Ambulatory Visit: Payer: Self-pay | Admitting: Physician Assistant

## 2022-10-27 DIAGNOSIS — N6310 Unspecified lump in the right breast, unspecified quadrant: Secondary | ICD-10-CM

## 2023-04-20 ENCOUNTER — Ambulatory Visit
Admission: RE | Admit: 2023-04-20 | Discharge: 2023-04-20 | Disposition: A | Discharge status: Home / Self Care | Payer: Medicare HMO | Source: Ambulatory Visit | Attending: Physician Assistant | Admitting: Physician Assistant

## 2023-04-20 DIAGNOSIS — N6310 Unspecified lump in the right breast, unspecified quadrant: Secondary | ICD-10-CM

## 2024-03-17 ENCOUNTER — Other Ambulatory Visit: Payer: Self-pay | Admitting: Physician Assistant

## 2024-03-17 DIAGNOSIS — Z1231 Encounter for screening mammogram for malignant neoplasm of breast: Secondary | ICD-10-CM

## 2024-04-20 ENCOUNTER — Ambulatory Visit
Admission: RE | Admit: 2024-04-20 | Discharge: 2024-04-20 | Disposition: A | Source: Ambulatory Visit | Attending: Physician Assistant | Admitting: Physician Assistant

## 2024-04-20 DIAGNOSIS — Z1231 Encounter for screening mammogram for malignant neoplasm of breast: Secondary | ICD-10-CM

## 2024-05-26 NOTE — Progress Notes (Signed)
 "  Patient:  Lindsay Collier    DOB: 02-26-1956, age 68 y.o. MRN: [id number]  PCP: Bernita Ned, PA-C  Computer technology was used to create visit note. Consent from the patient/caregiver was obtained prior to its use.       Assessment and Plan   1. Loss of appetite (Primary) -     CBC And Differential; Future; Expected date: 05/26/2024 -     Urinalysis; Future; Expected date: 05/26/2024 -     Comprehensive Metabolic Panel; Future; Expected date: 06/02/2024 -     TSH Rfx on Abnormal to Free T4; Future; Expected date: 06/02/2024 -     XR Chest Pa And Lateral; Future -     XR Abdomen Acute Series; Future 2. Weight loss, unintentional -     CBC And Differential; Future; Expected date: 05/26/2024 -     Urinalysis; Future; Expected date: 05/26/2024 -     Comprehensive Metabolic Panel; Future; Expected date: 06/02/2024 -     TSH Rfx on Abnormal to Free T4; Future; Expected date: 06/02/2024 -     XR Chest Pa And Lateral; Future 3. Malaise and fatigue -     CBC And Differential; Future; Expected date: 05/26/2024 -     Urinalysis; Future; Expected date: 05/26/2024 -     Comprehensive Metabolic Panel; Future; Expected date: 06/02/2024 -     TSH Rfx on Abnormal to Free T4; Future; Expected date: 06/02/2024 4. Severe early onset Alzheimer's dementia without behavioral disturbance, psychotic disturbance, mood disturbance, or anxiety (*)    Assessment & Plan 1. Generalized weakness, fatigue, malaise, and loss of appetite: - Symptoms include generalized weakness, fatigue, malaise, and loss of appetite for the last 3 days, with decreased appetite noted for about a week. - Physical exam findings include regular heartbeat and clear lung sounds. She has lost 12 pounds since 10/2023. - A comprehensive evaluation will be conducted, including chest and abdominal x-rays, blood work, and a urine specimen. Previous tests such as mammogram (04/2024), CT of the chest (01/2024), and liver function tests  showed no indication of cancer. - Her husband is encouraged to offer her any liquids she is willing to drink, popsicles can be used, as well.  2. Medication management: - [employer] sent a letter indicating that she should not be on paroxetine  due to her age (over 28). - She can continue taking paroxetine  even though she is over 65, as the risk is low relative to the benefit.  Follow-up: The patient is scheduled for a follow-up visit on Tuesday.    Follow up for visit as previously scheduled, sooner if needed. Date of next visit with me: 05/30/2024  Risks, benefits, and alternatives of the medications and treatment plan prescribed today were discussed, and patient expressed understanding. Plan follow-up as discussed or as needed if any worsening symptoms or change in condition.  Patient voiced understanding of the treatment plan and agreed to attempt to comply.      Subjective   This a 68 y.o. female who presents with:     Patient presents with   Fatigue    Patient has not been eating for the past days to a week.      Chief complaint comments reviewed and discussed with patient in detail.  HPI    History of Present Illness The patient is a 67 year old woman with dementia who presents for evaluation of generalized weakness, fatigue, malaise, and loss of appetite for the last 3 days. She is accompanied by her husband.  History is provided almost entirely by her husband, [name].  Her husband reports that she has been experiencing a decrease in appetite and fluid intake for approximately a week.  She has not had a bowel movement in the past 3 days and has only urinated once yesterday, with no urination today.  There is no report of incontinence.  Her previously talkative nature has diminished over the past 3 to 4 days.  Last week, she experienced choking on hot corn, leading to vomiting on two consecutive days during dinner.  Last week, while he was bathing her, she experienced an  episode of lightheadedness during which she lost consciousness and had to be assisted back to her bedroom.  Her husband reports that once she was lying down, and he fanned her, she roused easily. She has not reported any abdominal pain.  Her husband notes that she clears her throat sometimes, but she is not coughing.  Previously, she would frequently visit the bathroom, up to 15 to 20 times a day, sometimes just standing in the bathroom, but this behavior has ceased.  She continues to recognize her husband but struggles with stair navigation due to leg weakness, which her husband attributes to lack of adequate nutrition and hydration. Her husband also mentions that she appears weak when standing.  She reports no pain, respiratory distress, or dysuria.   She has lost 12 pounds since June 2025.  She has a scheduled appointment for Tuesday of next week, but her husband was concerned to wait.  Social History: Marital Status: Married Alcohol: She has abstained from alcohol for many years.    Reviewed and updated this visit by provider: Tobacco  Allergies  Meds  Problems  Med Hx  Surg Hx  Fam Hx  PDMP        Review of Systems    As above.   Objective   Vitals:   05/26/24 1137  BP: 112/66  Patient Position: Sitting  Pulse: 86  Temp: 97 F (36.1 C)  TempSrc: Temporal  Height: 5' 3 (1.6 m)  Weight: 133 lb 3.2 oz (60.4 kg)  SpO2: 97%  BMI (Calculated): 23.6   Wt Readings from Last 3 Encounters:  05/26/24 133 lb 3.2 oz (60.4 kg)  11/15/23 145 lb (65.8 kg)  05/17/23 152 lb 6.4 oz (69.1 kg)     Physical Exam Constitutional:      General: She is awake. She is not in acute distress.    Appearance: Normal appearance. She is well-developed and well-groomed. She is not ill-appearing.  HENT:     Head: Normocephalic and atraumatic.     Right Ear: Hearing normal.     Left Ear: Hearing normal.  Eyes:     General: Lids are normal. Vision grossly intact. No scleral icterus.        Right eye: No discharge or hordeolum.        Left eye: No discharge or hordeolum.  Neck:     Thyroid : No thyroid  mass or thyromegaly.     Trachea: Phonation normal.  Cardiovascular:     Rate and Rhythm: Normal rate and regular rhythm. No extrasystoles are present.    Pulses:          Radial pulses are 2+ on the right side and 2+ on the left side.     Heart sounds: Normal heart sounds. No murmur heard.    No friction rub. No gallop.  Musculoskeletal:     Cervical back: Full passive range of motion  without pain and neck supple.  Pulmonary:     Effort: Pulmonary effort is normal.     Breath sounds: Normal breath sounds.  Lymphadenopathy:     Cervical: No cervical adenopathy.  Skin:    General: Skin is warm and dry.  Neurological:     Mental Status: She is confused.     Cranial Nerves: No dysarthria or facial asymmetry.     Comments: Shuffling gait  Psychiatric:        Attention and Perception: Perception normal. She is inattentive.        Mood and Affect: Mood normal. Affect is flat.        Speech: Speech normal.        Behavior: Behavior normal. Behavior is cooperative.        Cognition and Memory: Cognition is impaired. Memory is impaired.     Comments: Does not indicate any recognition of knowing me, new since our previous visit.        Bernita CANDIE Ned, PA-C     "

## 2024-05-27 ENCOUNTER — Other Ambulatory Visit: Payer: Self-pay

## 2024-05-27 ENCOUNTER — Inpatient Hospital Stay (HOSPITAL_BASED_OUTPATIENT_CLINIC_OR_DEPARTMENT_OTHER)
Admission: EM | Admit: 2024-05-27 | Discharge: 2024-05-30 | DRG: 641 | Disposition: A | Discharge status: Home Health | Attending: Student | Admitting: Student

## 2024-05-27 ENCOUNTER — Emergency Department (HOSPITAL_BASED_OUTPATIENT_CLINIC_OR_DEPARTMENT_OTHER)

## 2024-05-27 DIAGNOSIS — E860 Dehydration: Secondary | ICD-10-CM | POA: Diagnosis present

## 2024-05-27 DIAGNOSIS — Z90710 Acquired absence of both cervix and uterus: Secondary | ICD-10-CM | POA: Diagnosis not present

## 2024-05-27 DIAGNOSIS — Z833 Family history of diabetes mellitus: Secondary | ICD-10-CM

## 2024-05-27 DIAGNOSIS — I10 Essential (primary) hypertension: Secondary | ICD-10-CM | POA: Diagnosis present

## 2024-05-27 DIAGNOSIS — F17210 Nicotine dependence, cigarettes, uncomplicated: Secondary | ICD-10-CM | POA: Diagnosis present

## 2024-05-27 DIAGNOSIS — F02C0 Dementia in other diseases classified elsewhere, severe, without behavioral disturbance, psychotic disturbance, mood disturbance, and anxiety: Secondary | ICD-10-CM | POA: Diagnosis present

## 2024-05-27 DIAGNOSIS — E871 Hypo-osmolality and hyponatremia: Principal | ICD-10-CM | POA: Diagnosis present

## 2024-05-27 DIAGNOSIS — T502X5A Adverse effect of carbonic-anhydrase inhibitors, benzothiadiazides and other diuretics, initial encounter: Secondary | ICD-10-CM | POA: Diagnosis present

## 2024-05-27 DIAGNOSIS — Z66 Do not resuscitate: Secondary | ICD-10-CM | POA: Diagnosis present

## 2024-05-27 DIAGNOSIS — R531 Weakness: Secondary | ICD-10-CM | POA: Diagnosis not present

## 2024-05-27 DIAGNOSIS — E876 Hypokalemia: Secondary | ICD-10-CM | POA: Diagnosis present

## 2024-05-27 DIAGNOSIS — R338 Other retention of urine: Secondary | ICD-10-CM | POA: Diagnosis not present

## 2024-05-27 DIAGNOSIS — Z885 Allergy status to narcotic agent status: Secondary | ICD-10-CM | POA: Diagnosis not present

## 2024-05-27 DIAGNOSIS — E861 Hypovolemia: Secondary | ICD-10-CM | POA: Diagnosis present

## 2024-05-27 DIAGNOSIS — R339 Retention of urine, unspecified: Secondary | ICD-10-CM | POA: Diagnosis present

## 2024-05-27 DIAGNOSIS — E785 Hyperlipidemia, unspecified: Secondary | ICD-10-CM | POA: Diagnosis present

## 2024-05-27 DIAGNOSIS — G300 Alzheimer's disease with early onset: Secondary | ICD-10-CM | POA: Diagnosis present

## 2024-05-27 DIAGNOSIS — Z79899 Other long term (current) drug therapy: Secondary | ICD-10-CM

## 2024-05-27 DIAGNOSIS — F03C0 Unspecified dementia, severe, without behavioral disturbance, psychotic disturbance, mood disturbance, and anxiety: Secondary | ICD-10-CM | POA: Diagnosis not present

## 2024-05-27 DIAGNOSIS — Z23 Encounter for immunization: Secondary | ICD-10-CM

## 2024-05-27 DIAGNOSIS — F0390 Unspecified dementia without behavioral disturbance: Secondary | ICD-10-CM | POA: Diagnosis present

## 2024-05-27 DIAGNOSIS — Z8249 Family history of ischemic heart disease and other diseases of the circulatory system: Secondary | ICD-10-CM

## 2024-05-27 LAB — CBC
HCT: 37.6 % (ref 36.0–46.0)
HCT: 36.6 % (ref 36.0–46.0)
Hemoglobin: 14.1 g/dL (ref 12.0–15.0)
Hemoglobin: 13.1 g/dL (ref 12.0–15.0)
MCH: 31.5 pg (ref 26.0–34.0)
MCH: 30.8 pg (ref 26.0–34.0)
MCHC: 37.5 g/dL — ABNORMAL HIGH (ref 30.0–36.0)
MCHC: 35.8 g/dL (ref 30.0–36.0)
MCV: 84.1 fL (ref 80.0–100.0)
MCV: 85.9 fL (ref 80.0–100.0)
Platelets: 311 K/uL (ref 150–400)
Platelets: 285 K/uL (ref 150–400)
RBC: 4.47 MIL/uL (ref 3.87–5.11)
RBC: 4.26 MIL/uL (ref 3.87–5.11)
RDW: 11.3 % — ABNORMAL LOW (ref 11.5–15.5)
RDW: 11.5 % (ref 11.5–15.5)
WBC: 8.3 K/uL (ref 4.0–10.5)
WBC: 7.4 K/uL (ref 4.0–10.5)
nRBC: 0 % (ref 0.0–0.2)
nRBC: 0 % (ref 0.0–0.2)

## 2024-05-27 LAB — BASIC METABOLIC PANEL WITH GFR
Anion gap: 9 (ref 5–15)
Anion gap: 8 (ref 5–15)
BUN: 5 mg/dL — ABNORMAL LOW (ref 8–23)
BUN: 5 mg/dL — ABNORMAL LOW (ref 8–23)
CO2: 30 mmol/L (ref 22–32)
CO2: 29 mmol/L (ref 22–32)
Calcium: 9.6 mg/dL (ref 8.9–10.3)
Calcium: 9.5 mg/dL (ref 8.9–10.3)
Chloride: 84 mmol/L — ABNORMAL LOW (ref 98–111)
Chloride: 86 mmol/L — ABNORMAL LOW (ref 98–111)
Creatinine, Ser: 0.66 mg/dL (ref 0.44–1.00)
Creatinine, Ser: 0.58 mg/dL (ref 0.44–1.00)
GFR, Estimated: >60 mL/min
GFR, Estimated: >60 mL/min
Glucose, Bld: 99 mg/dL (ref 70–99)
Glucose, Bld: 95 mg/dL (ref 70–99)
Potassium: 3.1 mmol/L — ABNORMAL LOW (ref 3.5–5.1)
Potassium: 3.7 mmol/L (ref 3.5–5.1)
Sodium: 123 mmol/L — ABNORMAL LOW (ref 135–145)
Sodium: 122 mmol/L — ABNORMAL LOW (ref 135–145)

## 2024-05-27 LAB — OSMOLALITY: Osmolality: 246 mosm/kg — CL (ref 275–295)

## 2024-05-27 LAB — URINALYSIS, ROUTINE W REFLEX MICROSCOPIC
Bilirubin Urine: NEGATIVE
Glucose, UA: NEGATIVE mg/dL
Hgb urine dipstick: NEGATIVE
Ketones, ur: NEGATIVE mg/dL
Leukocytes,Ua: NEGATIVE
Nitrite: NEGATIVE
Specific Gravity, Urine: 1.015 (ref 1.005–1.030)
pH: 7 (ref 5.0–8.0)

## 2024-05-27 LAB — OSMOLALITY, URINE: Osmolality, Ur: 396 mosm/kg (ref 300–900)

## 2024-05-27 LAB — COMPREHENSIVE METABOLIC PANEL WITH GFR
ALT: 17 U/L (ref 0–44)
AST: 23 U/L (ref 15–41)
Albumin: 4.1 g/dL (ref 3.5–5.0)
Alkaline Phosphatase: 208 U/L — ABNORMAL HIGH (ref 38–126)
Anion gap: 12 (ref 5–15)
BUN: 7 mg/dL — ABNORMAL LOW (ref 8–23)
CO2: 29 mmol/L (ref 22–32)
Calcium: 10.1 mg/dL (ref 8.9–10.3)
Chloride: 75 mmol/L — ABNORMAL LOW (ref 98–111)
Creatinine, Ser: 0.8 mg/dL (ref 0.44–1.00)
GFR, Estimated: >60 mL/min
Glucose, Bld: 115 mg/dL — ABNORMAL HIGH (ref 70–99)
Potassium: 2.6 mmol/L — CL (ref 3.5–5.1)
Sodium: 117 mmol/L — CL (ref 135–145)
Total Bilirubin: 0.7 mg/dL (ref 0.0–1.2)
Total Protein: 7.4 g/dL (ref 6.5–8.1)

## 2024-05-27 LAB — TSH: TSH: 0.81 u[IU]/mL (ref 0.350–4.500)

## 2024-05-27 LAB — SODIUM, URINE, RANDOM: Sodium, Ur: 31 mmol/L

## 2024-05-27 LAB — MAGNESIUM: Magnesium: 2.3 mg/dL (ref 1.7–2.4)

## 2024-05-27 LAB — PHOSPHORUS: Phosphorus: 2.7 mg/dL (ref 2.5–4.6)

## 2024-05-27 LAB — HIV ANTIBODY (ROUTINE TESTING W REFLEX): HIV Screen 4th Generation wRfx: NONREACTIVE

## 2024-05-27 MED ORDER — LACTATED RINGERS IV BOLUS
1000.0000 mL | Freq: Once | INTRAVENOUS | Status: AC
  Administered 2024-05-27: 1000 mL via INTRAVENOUS

## 2024-05-27 MED ORDER — SODIUM CHLORIDE 0.9 % IV SOLN
INTRAVENOUS | Status: DC | PRN
  Administered 2024-05-27: 10 mL via INTRAVENOUS

## 2024-05-27 MED ORDER — POTASSIUM CHLORIDE CRYS ER 20 MEQ PO TBCR: 40.0000 meq | EXTENDED_RELEASE_TABLET | Freq: Once | ORAL | Status: DC

## 2024-05-27 MED ORDER — POTASSIUM CHLORIDE CRYS ER 20 MEQ PO TBCR
60.0000 meq | EXTENDED_RELEASE_TABLET | Freq: Once | ORAL | Status: AC
  Administered 2024-05-27: 60 meq via ORAL
  Filled 2024-05-27: qty 3

## 2024-05-27 MED ORDER — DONEPEZIL HCL 10 MG PO TABS
10.0000 mg | ORAL_TABLET | Freq: Every day | ORAL | Status: DC
  Administered 2024-05-27 – 2024-05-29 (×3): 10 mg via ORAL
  Filled 2024-05-27 (×2): qty 1

## 2024-05-27 MED ORDER — ACETAMINOPHEN 325 MG PO TABS: 650.0000 mg | ORAL_TABLET | Freq: Four times a day (QID) | ORAL | Status: DC | PRN

## 2024-05-27 MED ORDER — POTASSIUM CHLORIDE 10 MEQ/100ML IV SOLN
10.0000 meq | INTRAVENOUS | Status: AC
  Administered 2024-05-27 (×3): 10 meq via INTRAVENOUS
  Filled 2024-05-27 (×2): qty 100

## 2024-05-27 MED ORDER — ONDANSETRON HCL 4 MG PO TABS: 4.0000 mg | ORAL_TABLET | Freq: Four times a day (QID) | ORAL | Status: DC | PRN

## 2024-05-27 MED ORDER — SENNOSIDES-DOCUSATE SODIUM 8.6-50 MG PO TABS: 1.0000 | ORAL_TABLET | Freq: Every evening | ORAL | Status: DC | PRN

## 2024-05-27 MED ORDER — ONDANSETRON HCL 4 MG/2ML IJ SOLN: 4.0000 mg | Freq: Four times a day (QID) | INTRAMUSCULAR | Status: DC | PRN

## 2024-05-27 MED ORDER — MEMANTINE HCL 10 MG PO TABS
10.0000 mg | ORAL_TABLET | Freq: Two times a day (BID) | ORAL | Status: DC
  Administered 2024-05-27 – 2024-05-30 (×6): 10 mg via ORAL
  Filled 2024-05-27 (×3): qty 1

## 2024-05-27 MED ORDER — CHLORHEXIDINE GLUCONATE CLOTH 2 % EX PADS
6.0000 | MEDICATED_PAD | Freq: Every day | CUTANEOUS | Status: DC
  Administered 2024-05-27 – 2024-05-30 (×2): 6 via TOPICAL

## 2024-05-27 MED ORDER — ORAL CARE MOUTH RINSE: 15.0000 mL | OROMUCOSAL | Status: DC | PRN

## 2024-05-27 MED ORDER — ACETAMINOPHEN 650 MG RE SUPP: 650.0000 mg | Freq: Four times a day (QID) | RECTAL | Status: DC | PRN

## 2024-05-27 MED ORDER — POTASSIUM CHLORIDE 20 MEQ PO PACK
40.0000 meq | PACK | ORAL | Status: DC
  Filled 2024-05-27: qty 2

## 2024-05-27 MED ORDER — ALBUTEROL SULFATE (2.5 MG/3ML) 0.083% IN NEBU: 2.5000 mg | INHALATION_SOLUTION | Freq: Four times a day (QID) | RESPIRATORY_TRACT | Status: DC | PRN

## 2024-05-27 MED ORDER — ENOXAPARIN SODIUM 40 MG/0.4ML IJ SOSY
40.0000 mg | PREFILLED_SYRINGE | INTRAMUSCULAR | Status: DC
  Administered 2024-05-27 – 2024-05-29 (×3): 40 mg via SUBCUTANEOUS
  Filled 2024-05-27 (×2): qty 0.4

## 2024-05-27 MED ORDER — HYDRALAZINE HCL 25 MG PO TABS: 25.0000 mg | ORAL_TABLET | Freq: Three times a day (TID) | ORAL | Status: DC | PRN

## 2024-05-27 NOTE — H&P (Addendum)
 " History and Physical    Patient: Lindsay Collier FMW:[id number] DOB: [birth year] DOA: 05/27/2024 DOS: the patient was seen and examined on 05/27/2024 PCP: Juliane Che, PA   Referring Provider: Lamar Shan, MD Telemedicine Provider: Burnard Cunning, DO Patient Location: [facility] Referring Diagnosis: Hyponatremia Patient Name and DOB verified: Lindsay Collier, 07-13-1955 Patient consented to Telemedicine Evaluation: yes RN virtual assistant: Dasie Bergeron, RN Video encounter time and date: 05/27/2024 1:12 PM   Patient coming from: Home  Chief Complaint:  Chief Complaint  Patient presents with   Abnormal Labs   HPI: Lindsay Collier is a 68 y.o. female with medical history significant of dementia, HTN on hydrochlorothiazide  who presented to [facility] ED due to hyponatremia on outpatient labs with PCP yesterday. Husband at bedside provides history, as pt's ability is limited by dementia.  He reports for about the past week patient having decreased appetite.  For 2 days last week, she had nausea vomiting after eating, since then has been eating and drinking very little but no further N/V.  Also reports patient has been less interactive than usual and much less mobile and active.  He reports she typically gets up and walks around throughout the day and has not been doing so.  She's become more weak, but still able to stand and walk.  Going up stairs to bedroom last night, he states her legs almost gave out near the top.  No falls.  She has been taking hydrochlorothiazide  daily.  He has not noticed any sign of seizures or patient appearing weak on 1 side of her body.  She seems a bit more confused that at her baseline.  They went to patient's PCP yesterday for evaluation that included x-rays of chest and abdomen, CBC, CMP, UA, TSH.  Patient was referred to the ER when results showed severe hyponatremia (Na 118) and hypokalemia (K 2.8).  ED course --  Initial vitals - temp 97.40F, HR 72, RR  18, BP 113/80, spo2 100% on room air. Labs obtained including CMP and CBC were notable for sodium 117, potassium 2.6, chloride 75, glucose 115, BUN 7, alk phos 208. Urinalysis was without any signs of infection. Imaging- - CT head without contrast showed no acute abnormalities.  Did show severe progressive lobar cerebral atrophy since 2015 suggesting advanced neurodegenerative disease.  Patient was treated in the ED with 1 L bolus LR fluids, IV K rider x 3, and is being admitted to the hospital for further evaluation and management as outlined in detail below for severe electrolyte abnormalities including hyponatremia, hypokalemia.   Review of Systems: unable to review all systems due to the inability of the patient to answer questions.   Past Medical History:  Diagnosis Date   Anemia    Anxiety    Hyperlipidemia    Hypertension    Smoker    Past Surgical History:  Procedure Laterality Date   ABDOMINAL HYSTERECTOMY     total   CESAREAN SECTION     COLONOSCOPY     2012   TONSILLECTOMY AND ADENOIDECTOMY     Social History:  reports that she has been smoking cigarettes. She has never used smokeless tobacco. She reports that she does not drink alcohol and does not use drugs.  Allergies[1]  Family History  Problem Relation Age of Onset   Hypertension Mother    Hypertension Father    Heart disease Father    Cancer Father        Prostate   Diabetes  Brother    Asthma Son    Alzheimer's disease Neg Hx    Dementia Neg Hx     Prior to Admission medications  Medication Sig Start Date End Date Taking? Authorizing Provider  ALPRAZolam  (XANAX ) 0.25 MG tablet TAKE 1 TABLET BY MOUTH TWICE A DAY AS NEEDED 09/29/16   Juliane Che, PA  atorvastatin  (LIPITOR) 20 MG tablet Take 20 mg by mouth daily. 02/13/20   [provider]  calcium  carbonate (OSCAL) 1500 (600 Ca) MG TABS tablet Take by mouth.    [provider]  donepezil  (ARICEPT ) 10 MG tablet Take 1 tablet (10 mg  total) by mouth at bedtime. 09/03/21   Millikan, [name], NP  ferrous sulfate 325 (65 FE) MG tablet Take 325 mg by mouth daily with breakfast.    [provider]  fish oil-omega-3 fatty acids 1000 MG capsule Take 2 g by mouth daily.    [provider]  lisinopril -hydrochlorothiazide  (PRINZIDE ,ZESTORETIC ) 20-25 MG tablet Take 1 tablet by mouth daily. 05/21/17   Juliane Che, PA  memantine  (NAMENDA ) 10 MG tablet Take 1 tablet (10 mg total) by mouth 2 (two) times daily. 09/03/21   Millikan, [name], NP  Multiple Vitamin (MULTIVITAMIN) tablet Take 1 tablet by mouth daily.    [provider]  OVER THE COUNTER MEDICATION Magnesium 250mg     [provider]  OVER THE COUNTER MEDICATION Vit D-3 1000iu    [provider]  OVER THE COUNTER MEDICATION Aloe Vera juice once daily    [provider]  PARoxetine  (PAXIL ) 30 MG tablet Take 1 tablet (30 mg total) by mouth daily. TAKE 1 TABLET (30 MG TOTAL) BY MOUTH DAILY. 11/10/17   Juliane Che, PA    Physical Exam: Vitals:   05/27/24 1022 05/27/24 1427 05/27/24 1535  BP: 113/80  113/71  Pulse: 72  65  Resp: 18  18  Temp: 97.7 F (36.5 C) 98.3 F (36.8 C) 98.3 F (36.8 C)  TempSrc:  Oral Oral  SpO2: 100%  100%   Bedside physical exam was performed by RN listed above. Below exam findings are based on their in person physical exam findings and my observations during virtual encounter.  General exam: awake, alert, no acute distress HEENT: nonverbal during encounter, hearing grossly normal  Respiratory system: CTAB diminished right base, no wheezes or rhonchi, normal respiratory effort, on room air. Cardiovascular system: normal S1/S2, RRR, no pedal edema.   Gastrointestinal system: soft, NT, ND, +bowel sounds. Central nervous system: grossly non-focal, pt follows commands, non-verbal during encounter, exam limited by dementia Extremities: moves all, no edema Skin: dry, intact, normal temperature per  RN Psychiatry: normal mood, congruent affect, judgement and insight appear normal   Data Reviewed:  As reviewed in detail above  Assessment and Plan: * Hyponatremia Hypovolemic. Likely acute. Sodium 118 on outpatient labs yesterday with PCP, 117 this AM in ED.  In setting of 1 week history of poor appetite, nausea & vomiting. Pt also on hydrochlorothiazide .  No signs of seizure per husband, and no other neurologic changes. Mental status close to baseline but less interactive.  Normal sodium 137 in June.  Suspect this is acute given history.  --Pt give 1 L bolus LR fluids in ED --Defer 3% saline for now --Cautious correction, goal Na 123-125 in 24 hours --Serial BMP's every 6 hours --Further IV fluids pending next sodium level - assess response to 1 L given first --Hold hydrochlorothiazide  --Neuro checks  PM update - 4:30 PM BMP sodium improved  to 123 after fluids given in ED.  Will hold off additional fluids for now to avoid overcorrection.  -- Follow serial BMPs  Generalized weakness Fall precautions PT/OT evaluations  HTN (hypertension) BP stable 113/80 on admission. --Hold hydrochlorothiazide  with hyponatremia.   --Monitor BP's --PRN oral hydralazine  for now  [facility]) Per PCP note is Severe early onset Alzheimer's dementia without behavioral disturbance, psychotic disturbance, mood disturbance, or anxiety. --Delirium precautions --Resume any home meds pending med history  Acute urinary retention Foley catheter was placed in the ED due to inability to void, so far 1100 cc output per ED RN.  Unclear etiology but no prior history reported per husband. --Voiding trial prior to discharge  Hypokalemia K 2.6 on admission.  Pt given IV K-rider x 3 in the ED.  40 mEq PO ordered as well --Follow up next BMP --Replace K PRN --Check Mg level      Advance Care Planning: Code status - full code Husband at bedside is patient's surrogate given patient's dementia.  He  states, at this point, if pt were to have cardiac or respiratory arrest, they would want all resuscitation efforts.  Consults: None at this time  Family Communication: husband at bedside  Severity of Illness: The appropriate patient status for this patient is INPATIENT. Inpatient status is judged to be reasonable and necessary in order to provide the required intensity of service to ensure the patient's safety. The patient's presenting symptoms, physical exam findings, and initial radiographic and laboratory data in the context of their chronic comorbidities is felt to place them at high risk for further clinical deterioration. Furthermore, it is not anticipated that the patient will be medically stable for discharge from the hospital within 2 midnights of admission.   * I certify that at the point of admission it is my clinical judgment that the patient will require inpatient hospital care spanning beyond 2 midnights from the point of admission due to high intensity of service, high risk for further deterioration and high frequency of surveillance required.*  Author: Burnard DELENA Cunning, DO 05/27/2024 5:54 PM  For on call review [email or web address].      [1]  Allergies Allergen Reactions   Codeine     Excessive sweating   "

## 2024-05-27 NOTE — ED Notes (Signed)
 Attempt to call report.  Floor RN unavailable and  to call back when ready

## 2024-05-27 NOTE — ED Triage Notes (Signed)
 Low sodium level of 118. Blood work yesterday at PCP. Less talkative, weakness. PMH dementia.

## 2024-05-27 NOTE — Assessment & Plan Note (Signed)
 Per PCP note is Severe early onset Alzheimer's dementia without behavioral disturbance, psychotic disturbance, mood disturbance, or anxiety. --Delirium precautions --Resume any home meds pending med history

## 2024-05-27 NOTE — Assessment & Plan Note (Signed)
 Foley catheter was placed in the ED due to inability to void, so far 1100 cc output per ED RN.  Unclear etiology but no prior history reported per husband. --Voiding trial prior to discharge

## 2024-05-27 NOTE — Assessment & Plan Note (Signed)
 BP stable 113/80 on admission. --Hold hydrochlorothiazide  with hyponatremia.   --Monitor BP's --PRN oral hydralazine  for now

## 2024-05-27 NOTE — ED Notes (Signed)
 [facility] att bedside

## 2024-05-27 NOTE — ED Provider Notes (Signed)
 " [place] EMERGENCY DEPARTMENT AT [facility] Provider Note   CSN: [id number] Arrival date & time: 05/27/24  9055     Patient presents with: Abnormal Labs   Darothy R Collier is a 68 y.o. female.   68 year old female history of dementia and hypertension on hydrochlorothiazide  who presents to the emergency department with hyponatremia.  History obtained predominantly from the patient's husband because the history is limited due to her dementia.  Says that for approximately a week has been having nausea and vomiting and decreased appetite.  He reports that in that timeframe she has been less interactive than usual and getting up and walking around less than usual.  No seizures.  Went to her primary care yesterday and had a chest x-ray, abdominal x-ray, and blood work that included a TSH and CMP that showed that she had a sodium of 118 and potassium of 2.8.  Last blood work was in June and was normal       Prior to Admission medications  Medication Sig Start Date End Date Taking? Authorizing Provider  ALPRAZolam  (XANAX ) 0.25 MG tablet TAKE 1 TABLET BY MOUTH TWICE A DAY AS NEEDED 09/29/16   Juliane Che, PA  atorvastatin  (LIPITOR) 20 MG tablet Take 20 mg by mouth daily. 02/13/20   [provider]  calcium  carbonate (OSCAL) 1500 (600 Ca) MG TABS tablet Take by mouth.    [provider]  donepezil  (ARICEPT ) 10 MG tablet Take 1 tablet (10 mg total) by mouth at bedtime. 09/03/21   Millikan, [name], NP  ferrous sulfate 325 (65 FE) MG tablet Take 325 mg by mouth daily with breakfast.    [provider]  fish oil-omega-3 fatty acids 1000 MG capsule Take 2 g by mouth daily.    [provider]  lisinopril -hydrochlorothiazide  (PRINZIDE ,ZESTORETIC ) 20-25 MG tablet Take 1 tablet by mouth daily. 05/21/17   Juliane Che, PA  memantine  (NAMENDA ) 10 MG tablet Take 1 tablet (10 mg total) by mouth 2 (two) times daily. 09/03/21   Millikan, [name], NP  Multiple Vitamin  (MULTIVITAMIN) tablet Take 1 tablet by mouth daily.    [provider]  OVER THE COUNTER MEDICATION Magnesium 250mg     [provider]  OVER THE COUNTER MEDICATION Vit D-3 1000iu    [provider]  OVER THE COUNTER MEDICATION Aloe Vera juice once daily    [provider]  PARoxetine  (PAXIL ) 30 MG tablet Take 1 tablet (30 mg total) by mouth daily. TAKE 1 TABLET (30 MG TOTAL) BY MOUTH DAILY. 11/10/17   Juliane Che, PA    Allergies: Codeine    Review of Systems  Updated Vital Signs BP 113/80 (BP Location: Right Arm)   Pulse 72   Temp 97.7 F (36.5 C)   Resp 18   LMP 06/01/2006   SpO2 100%   Physical Exam Vitals and nursing note reviewed.  Constitutional:      General: She is not in acute distress.    Appearance: She is well-developed.  HENT:     Head: Normocephalic and atraumatic.     Right Ear: External ear normal.     Left Ear: External ear normal.     Nose: Nose normal.  Eyes:     Extraocular Movements: Extraocular movements intact.     Conjunctiva/sclera: Conjunctivae normal.     Pupils: Pupils are equal, round, and reactive to light.  Cardiovascular:     Rate and Rhythm: Normal rate and regular rhythm.     Heart sounds:  No murmur heard. Pulmonary:     Effort: Pulmonary effort is normal. No respiratory distress.     Breath sounds: Normal breath sounds.  Abdominal:     General: Abdomen is flat. There is no distension.     Palpations: Abdomen is soft. There is no mass.     Tenderness: There is no abdominal tenderness. There is no guarding.  Musculoskeletal:     Cervical back: Normal range of motion and neck supple.     Right lower leg: No edema.     Left lower leg: No edema.  Skin:    General: Skin is warm and dry.  Neurological:     Mental Status: She is alert. Mental status is at baseline.     Cranial Nerves: No cranial nerve deficit.     Sensory: No sensory deficit.     Motor: No weakness.     Comments: Oriented to self  only.  At baseline per husband  Psychiatric:        Mood and Affect: Mood normal.     (all labs ordered are listed, but only abnormal results are displayed) Labs Reviewed  CBC - Abnormal; Notable for the following components:      Result Value   MCHC 37.5 (*)    RDW 11.3 (*)    All other components within normal limits  COMPREHENSIVE METABOLIC PANEL WITH GFR - Abnormal; Notable for the following components:   Sodium 117 (*)    Potassium 2.6 (*)    Chloride 75 (*)    Glucose, Bld 115 (*)    BUN 7 (*)    Alkaline Phosphatase 208 (*)    All other components within normal limits  OSMOLALITY - Abnormal; Notable for the following components:   Osmolality 246 (*)    All other components within normal limits  URINALYSIS, ROUTINE W REFLEX MICROSCOPIC - Abnormal; Notable for the following components:   Protein, ur TRACE (*)    All other components within normal limits  SODIUM, URINE, RANDOM  OSMOLALITY, URINE  MAGNESIUM  PHOSPHORUS  TSH  BASIC METABOLIC PANEL WITH GFR  BASIC METABOLIC PANEL WITH GFR    EKG: None  Radiology: CT Head Wo Contrast Result Date: 05/27/2024 EXAM: CT HEAD WITHOUT CONTRAST 05/27/2024 11:24:31 AM TECHNIQUE: CT of the head was performed without the administration of intravenous contrast. Automated exposure control, iterative reconstruction, and/or weight based adjustment of the mA/kV was utilized to reduce the radiation dose to as low as reasonably achievable. COMPARISON: Brain MRI 02/26/2014, head CT 10/21/2010. CLINICAL HISTORY: 68 year old female with altered mental status, weakness, decreased mental status, and hyponatremia. FINDINGS: Motion artifact initially. Imaging was repeated with better diagnostic quality. BRAIN AND VENTRICLES: Progressed and severe brain atrophy since previous exams. Ex vacuo appearing ventricular enlargement with asymmetric and disproportionate atrophy of the frontal and parietal lobes worse on the left (series 4 image 19). Mesial  temporal lobe structures appear relatively spared. Maintained gray white differentiation. No acute hemorrhage. No evidence of acute infarct. No extra-axial collection. No mass effect or midline shift. No suspicious intracranial vascular hyperdensity. Calcified atherosclerosis at the skull base. ORBITS: Postoperative changes to both globes since previous exams. SINUSES: Visible paranasal sinuses, tympanic cavities and mastoids are clear. SOFT TISSUES AND SKULL: No acute soft tissue abnormality. No skull fracture. IMPRESSION: 1. Severe progressive lobar cerebral atrophy since 2015 suggesting an advanced neurodegenerative disease. 2. No acute intracranial abnormality identified. Electronically signed by: Helayne Hurst MD 05/27/2024 11:45 AM EST RP Workstation: [id number]  Procedures   Medications Ordered in the ED  potassium chloride  10 mEq in 100 mL IVPB (10 mEq Intravenous New Bag/Given 05/27/24 1309)  potassium chloride  (KLOR-CON ) packet 40 mEq (40 mEq Oral Not Given 05/27/24 1319)  0.9 %  sodium chloride  infusion (10 mLs Intravenous New Bag/Given 05/27/24 1255)  lactated ringers  bolus 1,000 mL (0 mLs Intravenous Stopped 05/27/24 1309)    Clinical Course as of 05/27/24 1424  Sat May 27, 2024  1238 Discussed with Dr. Fausto from hospitalist.  [RP]    Clinical Course User Index [RP] Yolande Lamar BROCKS, MD                                 Medical Decision Making Amount and/or Complexity of Data Reviewed Labs: ordered. Radiology: ordered.  Risk Prescription drug management. Decision regarding hospitalization.   Lindsay Collier is a 68 year old female history of dementia and hypertension on hydrochlorothiazide  who presents to the emergency department with hyponatremia.    Initial Ddx:  Hyponatremia, hypovolemia, dehydration, hydrochlorothiazide  side effects, GI losses, SIADH  MDM/Course:  Patient presents to the emergency department with low sodium.  Does have dementia.  Husband  states that she has been less active than usual but is alert and oriented to self and is conversant this point in time.  No seizures or other symptoms that are concerning for severe hyponatremia.  Chest x-ray yesterday without any lung masses.  Also had an abdominal CT that did not show acute findings.  Abdominal exam is reassuring at this point in time as well.  Low concern for any acute intra-abdominal pathology.  Suspect that her hyponatremia is combined from GI losses recently as well as due to her hydrochlorothiazide  use.  Today sodium is 117 and potassium is 2.7.  Was given IV fluids due to concerns for hypovolemic hyponatremia.  Also given IV potassium and oral potassium.  Upon re-evaluation patient remained stable and at baseline.  Discussed with Dr. Fausto from hospitalist for admission.  Will continue to trend BMPs while she awaits a hospital  This patient presents to the ED for concern of complaints listed in HPI, this involves an extensive number of treatment options, and is a complaint that carries with it a high risk of complications and morbidity. Disposition including potential need for admission considered.   Dispo: Admit  I have reviewed the patients home medications and made adjustments as needed Additional history obtained from spouse Records reviewed Outpatient Clinic Notes The following labs were independently interpreted: Chemistry and show hyponatremia and hypokalemia I independently reviewed the following imaging with scope of interpretation limited to determining acute life threatening conditions related to emergency care: CT Head and agree with the radiologist interpretation with the following exceptions: none I personally reviewed and interpreted cardiac monitoring: normal sinus rhythm  I personally reviewed and interpreted the pt's EKG: see above for interpretation  Consults: Hospitalist Social Determinants of health:  Geriatric  CRITICAL CARE Performed by: Lamar BROCKS Yolande   Total critical care time: 30 minutes  Critical care time was exclusive of separately billable procedures and treating other patients.  Critical care was necessary to treat or prevent imminent or life-threatening deterioration.  Critical care was time spent personally by me on the following activities: development of treatment plan with patient and/or surrogate as well as nursing, discussions with consultants, evaluation of patient's response to treatment, examination of patient, obtaining history from patient or surrogate, ordering and  performing treatments and interventions, ordering and review of laboratory studies, ordering and review of radiographic studies, pulse oximetry and re-evaluation of patient's condition.  Portions of this note were generated with Scientist, clinical (histocompatibility and immunogenetics). Dictation errors may occur despite best attempts at proofreading.     Final diagnoses:  Hyponatremia  Hypokalemia  Generalized weakness    ED Discharge Orders     None          Yolande Lamar BROCKS, MD 05/27/24 1424  "

## 2024-05-27 NOTE — Assessment & Plan Note (Addendum)
 Hypovolemic. Likely acute. Sodium 118 on outpatient labs yesterday with PCP, 117 this AM in ED.  In setting of 1 week history of poor appetite, nausea & vomiting. Pt also on hydrochlorothiazide .  No signs of seizure per husband, and no other neurologic changes. Mental status close to baseline but less interactive.  Normal sodium 137 in June.  Suspect this is acute given history.  --Pt give 1 L bolus LR fluids in ED --Defer 3% saline for now --Cautious correction, goal Na 123-125 in 24 hours --Serial BMP's every 6 hours --Further IV fluids pending next sodium level - assess response to 1 L given first --Hold hydrochlorothiazide  --Neuro checks  PM update - 4:30 PM BMP sodium improved to 123 after fluids given in ED.  Will hold off additional fluids for now to avoid overcorrection.  -- Follow serial BMPs

## 2024-05-27 NOTE — Assessment & Plan Note (Signed)
 K 2.6 on admission.  Pt given IV K-rider x 3 in the ED.  40 mEq PO ordered as well --Follow up next BMP --Replace K PRN --Check Mg level

## 2024-05-27 NOTE — Assessment & Plan Note (Signed)
 Fall precautions PT/OT evaluations

## 2024-05-27 NOTE — Plan of Care (Signed)
   Problem: Education: Goal: Knowledge of General Education information will improve Description Including pain rating scale, medication(s)/side effects and non-pharmacologic comfort measures Outcome: Progressing

## 2024-05-27 NOTE — ED Notes (Signed)
 Called Deanne at [facility] for transport 14:00-TC

## 2024-05-28 ENCOUNTER — Encounter (HOSPITAL_COMMUNITY): Payer: Self-pay | Admitting: Internal Medicine

## 2024-05-28 DIAGNOSIS — I10 Essential (primary) hypertension: Secondary | ICD-10-CM | POA: Diagnosis not present

## 2024-05-28 DIAGNOSIS — R338 Other retention of urine: Secondary | ICD-10-CM

## 2024-05-28 DIAGNOSIS — F03C0 Unspecified dementia, severe, without behavioral disturbance, psychotic disturbance, mood disturbance, and anxiety: Secondary | ICD-10-CM | POA: Diagnosis not present

## 2024-05-28 DIAGNOSIS — E871 Hypo-osmolality and hyponatremia: Secondary | ICD-10-CM | POA: Diagnosis not present

## 2024-05-28 DIAGNOSIS — E876 Hypokalemia: Secondary | ICD-10-CM | POA: Diagnosis not present

## 2024-05-28 DIAGNOSIS — R531 Weakness: Secondary | ICD-10-CM | POA: Diagnosis not present

## 2024-05-28 LAB — BASIC METABOLIC PANEL WITH GFR
Anion gap: 8 (ref 5–15)
Anion gap: 8 (ref 5–15)
BUN: 6 mg/dL — ABNORMAL LOW (ref 8–23)
BUN: 6 mg/dL — ABNORMAL LOW (ref 8–23)
CO2: 26 mmol/L (ref 22–32)
CO2: 28 mmol/L (ref 22–32)
Calcium: 9.5 mg/dL (ref 8.9–10.3)
Calcium: 9.7 mg/dL (ref 8.9–10.3)
Chloride: 89 mmol/L — ABNORMAL LOW (ref 98–111)
Chloride: 90 mmol/L — ABNORMAL LOW (ref 98–111)
Creatinine, Ser: 0.67 mg/dL (ref 0.44–1.00)
Creatinine, Ser: 0.69 mg/dL (ref 0.44–1.00)
GFR, Estimated: >60 mL/min
GFR, Estimated: >60 mL/min
Glucose, Bld: 99 mg/dL (ref 70–99)
Glucose, Bld: 88 mg/dL (ref 70–99)
Potassium: 4.3 mmol/L (ref 3.5–5.1)
Potassium: 4 mmol/L (ref 3.5–5.1)
Sodium: 124 mmol/L — ABNORMAL LOW (ref 135–145)
Sodium: 127 mmol/L — ABNORMAL LOW (ref 135–145)

## 2024-05-28 MED ORDER — PAROXETINE HCL 20 MG PO TABS
20.0000 mg | ORAL_TABLET | Freq: Every day | ORAL | Status: DC
  Administered 2024-05-29 – 2024-05-30 (×2): 20 mg via ORAL

## 2024-05-28 MED ORDER — ALPRAZOLAM 0.25 MG PO TABS: 0.2500 mg | ORAL_TABLET | Freq: Two times a day (BID) | ORAL | Status: DC | PRN

## 2024-05-28 MED ORDER — SODIUM CHLORIDE 0.9 % IV SOLN: INTRAVENOUS | Status: DC

## 2024-05-28 MED ORDER — ATORVASTATIN CALCIUM 10 MG PO TABS
20.0000 mg | ORAL_TABLET | Freq: Every day | ORAL | Status: DC
  Administered 2024-05-28 – 2024-05-30 (×3): 20 mg via ORAL
  Filled 2024-05-28: qty 2

## 2024-05-28 MED ORDER — PAROXETINE HCL 20 MG PO TABS: 30.0000 mg | ORAL_TABLET | Freq: Every day | ORAL | Status: DC

## 2024-05-28 MED ORDER — ADULT MULTIVITAMIN W/MINERALS CH
1.0000 | ORAL_TABLET | Freq: Every day | ORAL | Status: DC
  Administered 2024-05-28 – 2024-05-30 (×3): 1 via ORAL
  Filled 2024-05-28: qty 1

## 2024-05-28 MED ORDER — SENNOSIDES-DOCUSATE SODIUM 8.6-50 MG PO TABS
2.0000 | ORAL_TABLET | Freq: Two times a day (BID) | ORAL | Status: AC
  Administered 2024-05-28 (×2): 2 via ORAL
  Filled 2024-05-28 (×2): qty 2

## 2024-05-28 MED ORDER — ENSURE PLUS HIGH PROTEIN PO LIQD
237.0000 mL | Freq: Two times a day (BID) | ORAL | Status: DC
  Administered 2024-05-29 – 2024-05-30 (×3): 237 mL via ORAL

## 2024-05-28 MED ORDER — POLYETHYLENE GLYCOL 3350 17 G PO PACK: 17.0000 g | PACK | Freq: Two times a day (BID) | ORAL | Status: DC | PRN

## 2024-05-28 MED ORDER — SENNOSIDES-DOCUSATE SODIUM 8.6-50 MG PO TABS: 2.0000 | ORAL_TABLET | Freq: Two times a day (BID) | ORAL | Status: DC | PRN

## 2024-05-28 NOTE — Progress Notes (Signed)
 " PROGRESS NOTE  Lindsay Collier FMW:[id number] DOB: 24-Nov-1955   PCP: Juliane Che, PA  Patient is from: Home.  Lives with husband.  Independently ambulates at baseline.  DOA: 05/27/2024 LOS: 1  Chief complaints Chief Complaint  Patient presents with   Abnormal Labs     Brief Narrative / Interim history: 68 year old F with PMH of severe dementia, HTN, anxiety, depression and prior tobacco use sent to ED by PCP due to hyponatremia and hypokalemia after she presented there with poor p.o. intake for about a week and nausea and vomiting for about 2 days.  Patient was admitted with hyponatremia, hypokalemia, dehydration and generalized weakness in the setting of poor p.o. intake, nausea, vomiting and concurrent use of HCTZ.  In ED, stable vitals.  Na 117.  K2.6.  CT head without acute finding.  Received fluid bolus and started on IV potassium and admitted for further care.   Subjective: Seen and examined earlier this morning.  No major events overnight or this morning.  No complaints but not a great historian.  She is awake and alert but only oriented to self and her husband.  Husband reports poor p.o. intake.  No further nausea or vomiting.  No bowel or bladder habit change.   Assessment and plan: Hypovolemic hyponatremia: Likely due to poor p.o. intake, nausea, vomiting and concurrent use of thiazide diuretics.  Improved with IV fluid. Recent Labs  Lab 05/27/24 1023 05/27/24 1630 05/27/24 2122 05/28/24 0457  NA 117* 123* 122* 124*  - Continue holding HCTZ.  Will discontinue on discharge. - IV NS at 75 cc an hour - Recheck BMP this afternoon  Hypokalemia: Due to thiazide diuretics. -Monitor replenish K and Mg as appropriate  Severe dementia without behavioral disturbance: Awake and alert but only oriented to self and husband. -Reorientation and delirium precaution.   Generalized weakness: Independently ambulates at baseline. -PT/OT eval   Essential hypertension:  Normotensive -Hold HCTZ and discontinue on discharge. -Consider amlodipine if BP elevated   Acute urinary retention: Unable to void in ED and Foley inserted with removal of 1.1 L.  - Continue Foley catheter at least for 1 week before trying voiding trial  Possible constipation: No bowel movement in about 5 days per husband. - Bowel regimen  Advance care planning: Discussed CODE STATUS including pros and cons of CPR and intubation with patient's husband at bedside.  Given her advanced dementia and age, husband thinks DNR and DNI is to his best interest -CODE STATUS changed to DNR.  RN notified.   Poor p.o. intake: Likely due to dementia. Body mass index is 25.81 kg/m. - Consult dietitian          DVT prophylaxis:  enoxaparin  (LOVENOX ) injection 40 mg Start: 05/27/24 1700  Code Status: DNR Family Communication: Updated patient's husband at bedside Level of care: Med-Surg Status is: Inpatient Remains inpatient appropriate because: Hyponatremia and poor p.o. intake   Final disposition: Likely home once medically stable   55 minutes with more than 50% spent in reviewing records, counseling patient/family and coordinating care.  Consultants:  None  Procedures: None  Microbiology summarized: None  Objective: Vitals:   05/27/24 2019 05/28/24 0011 05/28/24 0336 05/28/24 1036  BP: 106/71 115/69 125/66   Pulse: 66 67 77   Resp: 18 18 18    Temp: 98.5 F (36.9 C) 98.2 F (36.8 C) 97.6 F (36.4 C)   TempSrc: Oral Oral Oral   SpO2: 100% 100% 100%   Weight:    68.2 kg  Height:    5' 4 (1.626 m)    Examination:  GENERAL: No apparent distress.  Nontoxic. HEENT: MMM.  Vision and hearing grossly intact.  NECK: Supple.  No apparent JVD.  RESP:  No IWOB.  Fair aeration bilaterally. CVS:  RRR. Heart sounds normal.  ABD/GI/GU: BS+. Abd soft, NTND.  MSK/EXT:  Moves extremities. No apparent deformity. No edema.  SKIN: no apparent skin lesion or wound NEURO: AA.   Oriented to self and husband.  No apparent focal neuro deficit. PSYCH: Calm. Normal affect.   Sch Meds:  Scheduled Meds:  atorvastatin   20 mg Oral Daily   Chlorhexidine  Gluconate Cloth  6 each Topical Daily   donepezil   10 mg Oral QHS   enoxaparin  (LOVENOX ) injection  40 mg Subcutaneous Q24H   memantine   10 mg Oral BID   multivitamin with minerals  1 tablet Oral Daily   [START ON 05/29/2024] PARoxetine   20 mg Oral Daily   senna-docusate  2 tablet Oral BID   Continuous Infusions:  sodium chloride  75 mL/hr at 05/28/24 0857   PRN Meds:.acetaminophen  **OR** acetaminophen , albuterol , hydrALAZINE , ondansetron  **OR** ondansetron  (ZOFRAN ) IV, mouth rinse, polyethylene glycol, senna-docusate **FOLLOWED BY** [START ON 05/29/2024] senna-docusate  Antimicrobials: Anti-infectives (From admission, onward)    None        I have personally reviewed the following labs and images: CBC: Recent Labs  Lab 05/27/24 1023 05/27/24 1630  WBC 8.3 7.4  HGB 14.1 13.1  HCT 37.6 36.6  MCV 84.1 85.9  PLT 311 285   BMP &GFR Recent Labs  Lab 05/27/24 1023 05/27/24 1344 05/27/24 1630 05/27/24 2122 05/28/24 0457  NA 117*  --  123* 122* 124*  K 2.6*  --  3.1* 3.7 4.3  CL 75*  --  84* 86* 89*  CO2 29  --  30 29 26   GLUCOSE 115*  --  99 95 99  BUN 7*  --  5* 5* 6*  CREATININE 0.80  --  0.66 0.58 0.67  CALCIUM  10.1  --  9.6 9.5 9.5  MG 2.3  --   --   --   --   PHOS  --  2.7  --   --   --    Estimated Creatinine Clearance: 63 mL/min (by C-G formula based on SCr of 0.67 mg/dL). Liver & Pancreas: Recent Labs  Lab 05/27/24 1023  AST 23  ALT 17  ALKPHOS 208*  BILITOT 0.7  PROT 7.4  ALBUMIN 4.1   No results for input(s): LIPASE, AMYLASE in the last 168 hours. No results for input(s): AMMONIA in the last 168 hours. Diabetic: No results for input(s): HGBA1C in the last 72 hours. No results for input(s): GLUCAP in the last 168 hours. Cardiac Enzymes: No results for input(s):  CKTOTAL, CKMB, CKMBINDEX, TROPONINI in the last 168 hours. No results for input(s): PROBNP in the last 8760 hours. Coagulation Profile: No results for input(s): INR, PROTIME in the last 168 hours. Thyroid  Function Tests: Recent Labs    05/27/24 1349  TSH 0.810   Lipid Profile: No results for input(s): CHOL, HDL, LDLCALC, TRIG, CHOLHDL, LDLDIRECT in the last 72 hours. Anemia Panel: No results for input(s): VITAMINB12, FOLATE, FERRITIN, TIBC, IRON, RETICCTPCT in the last 72 hours. Urine analysis:    Component Value Date/Time   COLORURINE YELLOW 05/27/2024 1216   APPEARANCEUR CLEAR 05/27/2024 1216   APPEARANCEUR Clear 01/26/2018 0829   LABSPEC 1.015 05/27/2024 1216   PHURINE 7.0 05/27/2024 1216   GLUCOSEU NEGATIVE 05/27/2024  1216   HGBUR NEGATIVE 05/27/2024 1216   BILIRUBINUR NEGATIVE 05/27/2024 1216   BILIRUBINUR Negative 01/26/2018 0829   KETONESUR NEGATIVE 05/27/2024 1216   PROTEINUR TRACE (A) 05/27/2024 1216   UROBILINOGEN 0.2 11/20/2016 1132   NITRITE NEGATIVE 05/27/2024 1216   LEUKOCYTESUR NEGATIVE 05/27/2024 1216   Sepsis Labs: Invalid input(s): PROCALCITONIN, LACTICIDVEN  Microbiology: No results found for this or any previous visit (from the past 240 hours).  Radiology Studies: No results found.    Calyb Mcquarrie T. Rebecka Oelkers [facility]  If 7PM-7AM, please contact night-coverage [email or web address] 05/28/2024, 12:40 PM   "

## 2024-05-28 NOTE — Progress Notes (Signed)
 Initial Nutrition Assessment  DOCUMENTATION CODES:   Not applicable  INTERVENTION:   -Obtain new weight -Continue regular diet -MVI with minerals daily -Ensure Plus High Protein po BID, each supplement provides 350 kcal and 20 grams of protein  -Magic cup TID with meals, each supplement provides 290 kcal and 9 grams of protein   NUTRITION DIAGNOSIS:   Increased nutrient needs related to acute illness as evidenced by estimated needs.  GOAL:   Patient will meet greater than or equal to 90% of their needs  MONITOR:   PO intake, Supplement acceptance  REASON FOR ASSESSMENT:   Consult Assessment of nutrition requirement/status, Poor PO  ASSESSMENT:   PMH of severe dementia, HTN, anxiety, depression and prior tobacco use sent to ED by PCP due to hyponatremia and hypokalemia after she presented there with poor p.o. intake for about a week and nausea and vomiting for about 2 days.  Patient was admitted with hyponatremia, hypokalemia, dehydration and generalized weakness in the setting of poor p.o. intake, nausea, vomiting and concurrent use of HCTZ.  Patient admitted with hypovolemia hyponatremia.   Reviewed I/O's: -1.4 L x 24 hours  UOP: 1.4 L x 24 hours  Patient unavailable at time of visit. Attempted to speak with patient via call to hospital room phone, however, unable to reach. RD unable to obtain further nutrition-related history or complete nutrition-focused physical exam at this time.    Per MD notes, plan to continue to hold hydrochlorothiazide  and discontinue on discharge. MD suspect electrolyte abnormalities related to thiazide diuretics.  Per H&P, patient has had decreased oral intake over the past week secondary to nausea and vomiting after eating. She has also been more weak and less interactive than usual.   Patient on a regular diet. No meal completion data available to assess at this time.   Unsure of accuracy of current weight, as weight is identical from  09/04/23 encounter (unsure of this is a stated vs recorded weight). RD will obtain another weight to better assess weight trends.   Medications reviewed and include lovenox , senokot, and 0.9% sodium chloride  infusion @ 75 ml/hr.   Labs reviewed: Na: 124.    Diet Order:   Diet Order             Diet regular Room service appropriate? Yes; Fluid consistency: Thin  Diet effective now                   EDUCATION NEEDS:   No education needs have been identified at this time  Skin:  Skin Assessment: Reviewed RN Assessment  Last BM:  Unknown  Height:   Ht Readings from Last 1 Encounters:  05/28/24 5' 4 (1.626 m)    Weight:   Wt Readings from Last 1 Encounters:  05/28/24 68.2 kg    Ideal Body Weight:  54.5 kg  BMI:  Body mass index is 25.81 kg/m.  Estimated Nutritional Needs:   Kcal:  1850-2050  Protein:  90-105 grams  Fluid:  1.8-2.0 L    Margery ORN, RD, LDN, CDCES Registered Dietitian III Certified Diabetes Care and Education Specialist If unable to reach this RD, please use RD Inpatient group chat on secure chat between hours of 8am-4 pm daily

## 2024-05-29 DIAGNOSIS — R531 Weakness: Secondary | ICD-10-CM | POA: Diagnosis not present

## 2024-05-29 DIAGNOSIS — R338 Other retention of urine: Secondary | ICD-10-CM | POA: Diagnosis not present

## 2024-05-29 DIAGNOSIS — I10 Essential (primary) hypertension: Secondary | ICD-10-CM | POA: Diagnosis not present

## 2024-05-29 DIAGNOSIS — F03C0 Unspecified dementia, severe, without behavioral disturbance, psychotic disturbance, mood disturbance, and anxiety: Secondary | ICD-10-CM | POA: Diagnosis not present

## 2024-05-29 DIAGNOSIS — E871 Hypo-osmolality and hyponatremia: Secondary | ICD-10-CM | POA: Diagnosis not present

## 2024-05-29 DIAGNOSIS — E876 Hypokalemia: Secondary | ICD-10-CM | POA: Diagnosis not present

## 2024-05-29 LAB — CBC
HCT: 36.6 % (ref 36.0–46.0)
Hemoglobin: 12.9 g/dL (ref 12.0–15.0)
MCH: 31.5 pg (ref 26.0–34.0)
MCHC: 35.2 g/dL (ref 30.0–36.0)
MCV: 89.3 fL (ref 80.0–100.0)
Platelets: 291 K/uL (ref 150–400)
RBC: 4.1 MIL/uL (ref 3.87–5.11)
RDW: 12.3 % (ref 11.5–15.5)
WBC: 7.3 K/uL (ref 4.0–10.5)
nRBC: 0 % (ref 0.0–0.2)

## 2024-05-29 LAB — RENAL FUNCTION PANEL
Albumin: 3.5 g/dL (ref 3.5–5.0)
Anion gap: 9 (ref 5–15)
BUN: <5 mg/dL — ABNORMAL LOW (ref 8–23)
CO2: 28 mmol/L (ref 22–32)
Calcium: 9.6 mg/dL (ref 8.9–10.3)
Chloride: 94 mmol/L — ABNORMAL LOW (ref 98–111)
Creatinine, Ser: 0.73 mg/dL (ref 0.44–1.00)
GFR, Estimated: >60 mL/min
Glucose, Bld: 87 mg/dL (ref 70–99)
Phosphorus: 2.6 mg/dL (ref 2.5–4.6)
Potassium: 3.8 mmol/L (ref 3.5–5.1)
Sodium: 131 mmol/L — ABNORMAL LOW (ref 135–145)

## 2024-05-29 LAB — MAGNESIUM: Magnesium: 2.2 mg/dL (ref 1.7–2.4)

## 2024-05-29 NOTE — Progress Notes (Signed)
 " PROGRESS NOTE  Lindsay Collier FMW:[id number] DOB: 04-15-1956   PCP: Juliane Che, PA  Patient is from: Home.  Lives with husband.  Independently ambulates at baseline.  DOA: 05/27/2024 LOS: 2  Chief complaints Chief Complaint  Patient presents with   Abnormal Labs     Brief Narrative / Interim history: 68 year old F with PMH of severe dementia, HTN, anxiety, depression and prior tobacco use sent to ED by PCP due to hyponatremia and hypokalemia after she presented there with poor p.o. intake for about a week and nausea and vomiting for about 2 days.  Patient was admitted with hyponatremia, hypokalemia, dehydration and generalized weakness in the setting of poor p.o. intake, nausea, vomiting and concurrent use of HCTZ.  In ED, stable vitals.  Na 117.  K2.6.  CT head without acute finding.  Received fluid bolus and started on IV potassium and admitted for further care.  Subjective: Seen and examined earlier this morning.  No major events overnight or this morning.  No complaints but not a great historian.  She is awake and alert but only oriented to self and husband.  Denies pain or discomfort.  Sodium improved to 131.  Potassium corrected.  Husband is anxious to take her home today.  She has not walked either.   Assessment and plan: Hypovolemic hyponatremia: Likely due to poor p.o. intake, nausea, vomiting and concurrent use of thiazide diuretics and ACE inhibitors.  Improved with IV fluid. Recent Labs  Lab 05/27/24 1023 05/27/24 1630 05/27/24 2122 05/28/24 0457 05/28/24 1442 05/29/24 0546  NA 117* 123* 122* 124* 127* 131*  - Continue holding lisinopril /HCTZ.  Will discontinue on discharge. - Monitor off IV fluid. - Recheck BMP in the morning  Hypokalemia: Due to thiazide diuretics. -Monitor replenish K and Mg as appropriate -Discontinue HCTZ on discharge  Severe dementia without behavioral disturbance: Awake and alert but only oriented to self and  husband. -Reorientation and delirium precaution.   Generalized weakness: Independently ambulates at baseline. -PT/OT eval   Essential hypertension: Normotensive off home lisinopril /HCTZ -Will discontinue lisinopril /HCTZ on discharge -Consider amlodipine if BP elevated   Acute urinary retention: Unable to void in ED and Foley inserted with removal of 1.1 L.  - Continue Foley catheter at least for 1 week  - Needs outpatient follow-up with urology for voiding trial  Possible constipation: No bowel movement in about 5 days per husband. - Continue bowel regimen  Advance care planning: DNR.  See discussion on 12/28.   Poor p.o. intake/increase nutrient needs: Likely due to dementia. Body mass index is 25.81 kg/m. Nutrition Problem: Increased nutrient needs Etiology: acute illness Signs/Symptoms: estimated needs Interventions: Ensure Enlive (each supplement provides 350kcal and 20 grams of protein), MVI   DVT prophylaxis:  enoxaparin  (LOVENOX ) injection 40 mg Start: 05/27/24 1700  Code Status: DNR Family Communication: Updated patient's husband at bedside Level of care: Med-Surg Status is: Inpatient Remains inpatient appropriate because: Hyponatremia and poor p.o. intake   Final disposition: Likely home with home health on 12/30.   35 minutes with more than 50% spent in reviewing records, counseling patient/family and coordinating care.  Consultants:  None  Procedures: None  Microbiology summarized: None  Objective: Vitals:   05/28/24 1245 05/28/24 2004 05/29/24 0515 05/29/24 1245  BP: 108/64 124/65 100/71 117/75  Pulse: 66 75 65 70  Resp: 18 18 18 12   Temp: 98.1 F (36.7 C) 98.3 F (36.8 C) 98 F (36.7 C) 98.2 F (36.8 C)  TempSrc: Oral Oral Oral  SpO2: 99% 100% 99% 97%  Weight:      Height:        Examination:  GENERAL: No apparent distress.  Nontoxic. HEENT: MMM.  Vision and hearing grossly intact.  NECK: Supple.  No apparent JVD.  RESP:  No IWOB.   Fair aeration bilaterally. CVS:  RRR. Heart sounds normal.  ABD/GI/GU: BS+. Abd soft, NTND.  MSK/EXT:  Moves extremities. No apparent deformity. No edema.  SKIN: no apparent skin lesion or wound NEURO: AA.  Oriented to self and husband.  No apparent focal neuro deficit. PSYCH: Calm. Normal affect.   Sch Meds:  Scheduled Meds:  atorvastatin   20 mg Oral Daily   Chlorhexidine  Gluconate Cloth  6 each Topical Daily   donepezil   10 mg Oral QHS   enoxaparin  (LOVENOX ) injection  40 mg Subcutaneous Q24H   feeding supplement  237 mL Oral BID BM   memantine   10 mg Oral BID   multivitamin with minerals  1 tablet Oral Daily   PARoxetine   20 mg Oral Daily   Continuous Infusions:  sodium chloride  75 mL/hr at 05/28/24 0857   PRN Meds:.acetaminophen  **OR** acetaminophen , albuterol , hydrALAZINE , ondansetron  **OR** ondansetron  (ZOFRAN ) IV, mouth rinse, polyethylene glycol, [COMPLETED] senna-docusate **FOLLOWED BY** senna-docusate  Antimicrobials: Anti-infectives (From admission, onward)    None        I have personally reviewed the following labs and images: CBC: Recent Labs  Lab 05/27/24 1023 05/27/24 1630 05/29/24 0546  WBC 8.3 7.4 7.3  HGB 14.1 13.1 12.9  HCT 37.6 36.6 36.6  MCV 84.1 85.9 89.3  PLT 311 285 291   BMP &GFR Recent Labs  Lab 05/27/24 1023 05/27/24 1344 05/27/24 1630 05/27/24 2122 05/28/24 0457 05/28/24 1442 05/29/24 0546  NA 117*  --  123* 122* 124* 127* 131*  K 2.6*  --  3.1* 3.7 4.3 4.0 3.8  CL 75*  --  84* 86* 89* 90* 94*  CO2 29  --  30 29 26 28 28   GLUCOSE 115*  --  99 95 99 88 87  BUN 7*  --  5* 5* 6* 6* <5*  CREATININE 0.80  --  0.66 0.58 0.67 0.69 0.73  CALCIUM  10.1  --  9.6 9.5 9.5 9.7 9.6  MG 2.3  --   --   --   --   --  2.2  PHOS  --  2.7  --   --   --   --  2.6   Estimated Creatinine Clearance: 63 mL/min (by C-G formula based on SCr of 0.73 mg/dL). Liver & Pancreas: Recent Labs  Lab 05/27/24 1023 05/29/24 0546  AST 23  --   ALT 17   --   ALKPHOS 208*  --   BILITOT 0.7  --   PROT 7.4  --   ALBUMIN 4.1 3.5   No results for input(s): LIPASE, AMYLASE in the last 168 hours. No results for input(s): AMMONIA in the last 168 hours. Diabetic: No results for input(s): HGBA1C in the last 72 hours. No results for input(s): GLUCAP in the last 168 hours. Cardiac Enzymes: No results for input(s): CKTOTAL, CKMB, CKMBINDEX, TROPONINI in the last 168 hours. No results for input(s): PROBNP in the last 8760 hours. Coagulation Profile: No results for input(s): INR, PROTIME in the last 168 hours. Thyroid  Function Tests: Recent Labs    05/27/24 1349  TSH 0.810   Lipid Profile: No results for input(s): CHOL, HDL, LDLCALC, TRIG, CHOLHDL, LDLDIRECT in the last 72 hours. Anemia  Panel: No results for input(s): VITAMINB12, FOLATE, FERRITIN, TIBC, IRON, RETICCTPCT in the last 72 hours. Urine analysis:    Component Value Date/Time   COLORURINE YELLOW 05/27/2024 1216   APPEARANCEUR CLEAR 05/27/2024 1216   APPEARANCEUR Clear 01/26/2018 0829   LABSPEC 1.015 05/27/2024 1216   PHURINE 7.0 05/27/2024 1216   GLUCOSEU NEGATIVE 05/27/2024 1216   HGBUR NEGATIVE 05/27/2024 1216   BILIRUBINUR NEGATIVE 05/27/2024 1216   BILIRUBINUR Negative 01/26/2018 0829   KETONESUR NEGATIVE 05/27/2024 1216   PROTEINUR TRACE (A) 05/27/2024 1216   UROBILINOGEN 0.2 11/20/2016 1132   NITRITE NEGATIVE 05/27/2024 1216   LEUKOCYTESUR NEGATIVE 05/27/2024 1216   Sepsis Labs: Invalid input(s): PROCALCITONIN, LACTICIDVEN  Microbiology: No results found for this or any previous visit (from the past 240 hours).  Radiology Studies: No results found.    Lindsay Dain T. Andreana Klingerman [facility]  If 7PM-7AM, please contact night-coverage [email or web address] 05/29/2024, 1:47 PM   "

## 2024-05-29 NOTE — TOC Initial Note (Signed)
 Transition of Care [facility]) - Initial/Assessment Note    Patient Details  Name: Lindsay Collier MRN: [id number] Date of Birth: 02-Jul-1955  Transition [facility] Care [facility]) CM/SW Contact:    Alfonse JONELLE Rex, RN Phone Number: 05/29/2024, 3:07 PM  Clinical Narrative: Admitted from Home, Lives with husband. Independently ambulates at baseline.  PCP and health insurance on file. DC Plan:  Likely home with home health on 12/30.  INPT CM will continue to follow.         Patient Goals and CMS Choice            Expected Discharge Plan and Services                                              Prior Living Arrangements/Services                       Activities of Daily Living   ADL Screening (condition at time of admission) Independently performs ADLs?: Yes (appropriate for developmental age) Is the patient deaf or have difficulty hearing?: No Does the patient have difficulty seeing, even when wearing glasses/contacts?: No Does the patient have difficulty concentrating, remembering, or making decisions?: No  Permission Sought/Granted                  Emotional Assessment              Admission diagnosis:  Hypokalemia [E87.6] Hyponatremia [E87.1] Generalized weakness [R53.1] Patient Active Problem List   Diagnosis Date Noted   Hyponatremia 05/27/2024   Generalized weakness 05/27/2024   Hypokalemia 05/27/2024   Acute urinary retention 05/27/2024   Dementia (HCC) 02/07/2014   Tobacco use 02/07/2014   Anxiety and depression 09/22/2012   HTN (hypertension) 09/30/2011   Hypercholesterolemia 09/30/2011   PCP:  Juliane Che, PA Pharmacy:   [id number] - [place], [place] - [address] AT [facility] [address] [place] [address] Phone: [phone] Fax: [phone]     Social Drivers of Health (SDOH) Social History: SDOH Screenings   Food Insecurity: No Food Insecurity (05/27/2024)   Housing: Low Risk (05/27/2024)  Transportation Needs: No Transportation Needs (05/27/2024)  Utilities: Not At Risk (05/27/2024)  Financial Resource Strain: Low Risk (11/15/2023)   Received from [facility]  Physical Activity: Unknown (05/17/2023)   Received from [facility]  Social Connections: Socially Integrated (05/27/2024)  Stress: No Stress Concern Present (05/17/2023)   Received from [facility]  Tobacco Use: High Risk (05/28/2024)   SDOH Interventions:     Readmission Risk Interventions     No data to display

## 2024-05-29 NOTE — Plan of Care (Signed)
  Problem: Health Behavior/Discharge Planning: Goal: Ability to manage health-related needs will improve Outcome: Progressing   Problem: Nutrition: Goal: Adequate nutrition will be maintained Outcome: Progressing   Problem: Elimination: Goal: Will not experience complications related to bowel motility Outcome: Progressing   

## 2024-05-30 DIAGNOSIS — R531 Weakness: Secondary | ICD-10-CM | POA: Diagnosis not present

## 2024-05-30 DIAGNOSIS — F03C0 Unspecified dementia, severe, without behavioral disturbance, psychotic disturbance, mood disturbance, and anxiety: Secondary | ICD-10-CM | POA: Diagnosis not present

## 2024-05-30 DIAGNOSIS — R338 Other retention of urine: Secondary | ICD-10-CM | POA: Diagnosis not present

## 2024-05-30 DIAGNOSIS — E871 Hypo-osmolality and hyponatremia: Secondary | ICD-10-CM | POA: Diagnosis not present

## 2024-05-30 LAB — RENAL FUNCTION PANEL
Albumin: 3.5 g/dL (ref 3.5–5.0)
Anion gap: 9 (ref 5–15)
BUN: 7 mg/dL — ABNORMAL LOW (ref 8–23)
CO2: 29 mmol/L (ref 22–32)
Calcium: 9.7 mg/dL (ref 8.9–10.3)
Chloride: 95 mmol/L — ABNORMAL LOW (ref 98–111)
Creatinine, Ser: 0.68 mg/dL (ref 0.44–1.00)
GFR, Estimated: >60 mL/min
Glucose, Bld: 81 mg/dL (ref 70–99)
Phosphorus: 2.7 mg/dL (ref 2.5–4.6)
Potassium: 3.8 mmol/L (ref 3.5–5.1)
Sodium: 133 mmol/L — ABNORMAL LOW (ref 135–145)

## 2024-05-30 LAB — CBC
HCT: 36.8 % (ref 36.0–46.0)
Hemoglobin: 12.8 g/dL (ref 12.0–15.0)
MCH: 31.4 pg (ref 26.0–34.0)
MCHC: 34.8 g/dL (ref 30.0–36.0)
MCV: 90.2 fL (ref 80.0–100.0)
Platelets: 309 K/uL (ref 150–400)
RBC: 4.08 MIL/uL (ref 3.87–5.11)
RDW: 12.6 % (ref 11.5–15.5)
WBC: 8.4 K/uL (ref 4.0–10.5)
nRBC: 0 % (ref 0.0–0.2)

## 2024-05-30 LAB — MAGNESIUM: Magnesium: 2.2 mg/dL (ref 1.7–2.4)

## 2024-05-30 MED ORDER — SENNOSIDES-DOCUSATE SODIUM 8.6-50 MG PO TABS: 1.0000 | ORAL_TABLET | Freq: Two times a day (BID) | ORAL | Status: AC | PRN

## 2024-05-30 MED ORDER — INFLUENZA VAC SPLIT HIGH-DOSE 0.5 ML IM SUSY
0.5000 mL | PREFILLED_SYRINGE | INTRAMUSCULAR | Status: AC
  Administered 2024-05-30: 0.5 mL via INTRAMUSCULAR
  Filled 2024-05-30: qty 0.5

## 2024-05-30 NOTE — Evaluation (Signed)
 Physical Therapy Evaluation Patient Details Name: Lindsay Collier MRN: [id number] DOB: June 02, 1955 Today's Date: 05/30/2024  History of Present Illness  Pt is a 68 y/o female presents to therapy following admission to hospital on 05/27/2024 due to hyponatremia and hypokalemia in setting of decreased PO intake. Pt PMH includes but is not limited to: dementia, HTN, anemia, anxiety, HLS and tobacco use.  Clinical Impression      Pt admitted with above diagnosis.  Pt currently with functional limitations due to the deficits listed below (see PT Problem List). Pt in recliner when PT arrived. Spouse present. Pt agreeable to therapy intervention. Pt had participated with OT prior and OT relayed spouse concerned about step navigation. PT focused on gait and step navigation with spouse engaged with intervention and OT provided gait belt. Pt required min A for all functional mobility tasks with increased time, cues for safety, direction and attention to task, pt exhibited unsteady gait pattern with LOB and erratic stride length with min A and HHA 40 feet x 2, step navigation with L handrail, min A, HHA and cues with reciprocal pattern and attention to foot placement. Spouse indicated he felt comfortable with required level of assist required at this time for safe transition home. PT recommends [facility] services and pt and spouse in agreement, pt left seated in recliner, all needs in place. PT reached out to nursing staff per spouse request for training on foley management and flu shot prior to hospital d/c. Pt will benefit from acute skilled PT to increase their independence and safety with mobility to allow discharge.       If plan is discharge home, recommend the following: A little help with walking and/or transfers;A little help with bathing/dressing/bathroom;Assistance with cooking/housework;Assist for transportation;Help with stairs or ramp for entrance   Can travel by private vehicle        Equipment  Recommendations BSC/3in1  Recommendations for Other Services       Functional Status Assessment Patient has had a recent decline in their functional status and demonstrates the ability to make significant improvements in function in a reasonable and predictable amount of time.     Precautions / Restrictions Precautions Precautions: Fall Restrictions Weight Bearing Restrictions Per Provider Order: No      Mobility  Bed Mobility               General bed mobility comments: pt seated in recliner when PT arrived and returned to recliner at end of session    Transfers Overall transfer level: Needs assistance Equipment used: None, 1 person hand held assist Transfers: Sit to/from Stand Sit to Stand: Min assist           General transfer comment: sit to stand from recliner with min A to power up and multimodal cues with HHA    Ambulation/Gait Ambulation/Gait assistance: Min assist Gait Distance (Feet): 40 Feet Assistive device: 1 person hand held assist Gait Pattern/deviations: Step-to pattern, Staggering left, Staggering right, Trunk flexed, Narrow base of support Gait velocity: decreased     General Gait Details: slightly erratic pattern with decreased stride length, HHA with min A due to LOB to CGA, strong cues for safety, attention and direction  Stairs Stairs: Yes Stairs assistance: Min assist Stair Management: One rail Left Number of Stairs: 10 General stair comments: pt required cues, for safety, attention to task and foot placement with step navigation with L handrail and  with pt ascending and decending with a reciprocal pattern  Wheelchair Mobility  Tilt Bed    Modified Rankin (Stroke Patients Only)       Balance Overall balance assessment: Needs assistance Sitting-balance support: No upper extremity supported, Feet supported Sitting balance-Leahy Scale: Fair     Standing balance support: During functional activity, Single extremity  supported Standing balance-Leahy Scale: Fair Standing balance comment: HHA and CGA for static standing balance                             Pertinent Vitals/Pain Pain Assessment Pain Assessment: No/denies pain    Home Living Family/patient expects to be discharged to:: Private residence Living Arrangements: Spouse/significant other Available Help at Discharge: Family;Available 24 hours/day Type of Home: Other(Comment) (townhouse) Home Access: Stairs to enter   Entrance Stairs-Number of Steps: 2-3 Alternate Level Stairs-Number of Steps: flight Home Layout: Two level;Other (Comment) (bedroom upstairs; bathroom on both floors) Home Equipment: None      Prior Function Prior Level of Function : Needs assist             Mobility Comments: no AD for mobility ADLs Comments: Husband assists with LB dressing, showering tasks due to cognition. With weakness, husband opted to assist with sponge bathing rather than having pt stand in shower.     Extremity/Trunk Assessment   Upper Extremity Assessment Upper Extremity Assessment: Right hand dominant;Generalized weakness    Lower Extremity Assessment Lower Extremity Assessment: Overall WFL for tasks assessed    Cervical / Trunk Assessment Cervical / Trunk Assessment: Normal  Communication   Communication Communication: No apparent difficulties    Cognition Arousal: Alert Behavior During Therapy: Flat affect, WFL for tasks assessed/performed, Restless   PT - Cognitive impairments: History of cognitive impairments, Memory, Attention, Problem solving, Safety/Judgement                         Following commands: Impaired Following commands impaired: Follows one step commands with increased time, Only follows one step commands consistently     Cueing Cueing Techniques: Verbal cues     General Comments      Exercises     Assessment/Plan    PT Assessment Patient needs continued PT services  PT  Problem List Decreased activity tolerance;Decreased balance;Decreased mobility;Decreased coordination;Decreased cognition;Decreased safety awareness       PT Treatment Interventions Gait training;Stair training;Functional mobility training;Therapeutic activities;Therapeutic exercise;Balance training;Neuromuscular re-education;Cognitive remediation;Patient/family education    PT Goals (Current goals can be found in the Care Plan section)  Acute Rehab PT Goals Patient Stated Goal: to go home today PT Goal Formulation: With patient Time For Goal Achievement: 06/13/24 Potential to Achieve Goals: Good    Frequency Min 3X/week     Co-evaluation               AM-PAC PT 6 Clicks Mobility  Outcome Measure Help needed turning from your back to your side while in a flat bed without using bedrails?: A Little Help needed moving from lying on your back to sitting on the side of a flat bed without using bedrails?: A Little Help needed moving to and from a bed to a chair (including a wheelchair)?: A Little Help needed standing up from a chair using your arms (e.g., wheelchair or bedside chair)?: A Little Help needed to walk in hospital room?: A Little Help needed climbing 3-5 steps with a railing? : A Little 6 Click Score: 18    End of Session Equipment Utilized During Treatment: Gait  belt Activity Tolerance: Patient tolerated treatment well Patient left: in chair;with call bell/phone within reach;with chair alarm set;with family/visitor present Nurse Communication: Mobility status PT Visit Diagnosis: Unsteadiness on feet (R26.81);Other abnormalities of gait and mobility (R26.89);Muscle weakness (generalized) (M62.81);Difficulty in walking, not elsewhere classified (R26.2)    Time: 8971-8952 PT Time Calculation (min) (ACUTE ONLY): 19 min   Charges:   PT Evaluation $PT Eval Low Complexity: 1 Low   PT General Charges $$ ACUTE PT VISIT: 1 Visit         [place], PT Acute  Rehab   [place] VEAR Drone 05/30/2024, 12:03 PM

## 2024-05-30 NOTE — TOC Transition Note (Signed)
 Transition of Care [facility]) - Discharge Note   Patient Details  Name: Lindsay Collier MRN: [id number] Date of Birth: 06/02/1955  [facility] CM/SW Contact:  Heather DELENA Saltness, LCSW Phone Number: 05/30/2024, 10:52 AM   Clinical Narrative:    Pt discharging home today. CSW spoke with pt's spouse, Terry Mikolajczak [phone], via phone call to discuss discharge planning and PT's recommendation for [facility] PT/OT/RN services. Spouse agreeable with HH recommendation, denies [facility] agency preference. HH PT/OT/RN services set up with [facility]. HH orders placed. PT also recommending BSC/3in1 upon discharge. BSC ordered through [employer]. Per [place], it will be delivered to the home. Pt's spouse to transport pt home upon discharge.    Final next level of care: Home w Home Health Services Barriers to Discharge: Barriers Resolved   Patient Goals and CMS Choice Patient states their goals for this hospitalization and ongoing recovery are:: To return home CMS Medicare.gov Compare Post Acute Care list provided to:: Patient Represenative (must comment) Choice offered to / list presented to : Spouse [place] ownership interest in [facility].provided to:: Spouse    Discharge Placement Home with [facility] services  Patient to be transferred to facility by: Spouse Name of family member notified: Terry Stroh Patient and family notified of of transfer: 05/30/24  Discharge Plan and Services Additional resources added to the After Visit Summary for  Follow Up                DME Arranged: Bedside commode DME Agency: [employer] Date DME Agency Contacted: 05/30/24 Time DME Agency Contacted: 1040 Representative spoke with at DME Agency: Mitch HH Arranged: PT, OT, RN HH Agency: [facility] Date [facility] Agency Contacted: 05/30/24 Time HH Agency Contacted: 1034 Representative spoke with at [facility] Agency: [facility]  Social Drivers of Health (SDOH) Interventions SDOH Screenings   Food Insecurity: No  Food Insecurity (05/27/2024)  Housing: Low Risk (05/27/2024)  Transportation Needs: No Transportation Needs (05/27/2024)  Utilities: Not At Risk (05/27/2024)  Financial Resource Strain: Low Risk (11/15/2023)   Received from [facility]  Physical Activity: Unknown (05/17/2023)   [facility] from [facility]  Social Connections: Socially Integrated (05/27/2024)  Stress: No Stress Concern Present (05/17/2023)   Received from [facility]  Tobacco Use: High Risk (05/28/2024)     Readmission Risk Interventions     No data to display           Signed: Heather Saltness, MSW, LCSW Clinical Social Worker Inpatient Care Management 05/30/2024 12:37 PM

## 2024-05-30 NOTE — Plan of Care (Signed)
" °  Problem: Education: Goal: Knowledge of General Education information will improve Description: Including pain rating scale, medication(s)/side effects and non-pharmacologic comfort measures Outcome: Progressing   Problem: Health Behavior/Discharge Planning: Goal: Ability to manage health-related needs will improve Outcome: Progressing   Problem: Clinical Measurements: Goal: Ability to maintain clinical measurements within normal limits will improve Outcome: Progressing Goal: Will remain free from infection Outcome: Progressing Goal: Diagnostic test results will improve Outcome: Progressing Goal: Respiratory complications will improve Outcome: Progressing Goal: Cardiovascular complication will be avoided Outcome: Progressing   Problem: Activity: Goal: Risk for activity intolerance will decrease Outcome: Progressing   Problem: Nutrition: Goal: Adequate nutrition will be maintained Outcome: Progressing   Problem: Coping: Goal: Level of anxiety will decrease Outcome: Progressing   Problem: Elimination: Goal: Will not experience complications related to bowel motility Outcome: Progressing Goal: Will not experience complications related to urinary retention Outcome: Progressing   Problem: Pain Managment: Goal: General experience of comfort will improve and/or be controlled Outcome: Progressing   Problem: Safety: Goal: Ability to remain free from injury will improve Outcome: Progressing   Problem: Skin Integrity: Goal: Risk for impaired skin integrity will decrease Outcome: Progressing   Problem: Activity: Goal: Ability to tolerate increased activity will improve Outcome: Progressing   Problem: Nutritional: Goal: Achievement of adequate weight for body size and type will improve Outcome: Progressing Goal: Consumption of the prescribed amount of daily calories will improve Outcome: Progressing   "

## 2024-05-30 NOTE — Evaluation (Signed)
 Occupational Therapy Evaluation Patient Details Name: Lindsay Collier MRN: [id number] DOB: 04-01-1956 Today's Date: 05/30/2024   History of Present Illness   Pt is a 68 y/o female admitted with hyponatremia and hypokalemia in setting of decreased PO intake. PMH: dementia, HTN     Clinical Impressions PTA, pt lives with spouse, typically ambulatory without AD and receives assist for most ADLs d/t baseline dementia. Pt presents now with deficits in standing balance, strength and endurance. Husband at bedside and hands on to assist. Pt requires Min A for bathroom mobility w/ handheld assist and gait belt. Pt requires Mod-Max A for ADL mgmt d/t cognition. Educated on foley cath mgmt with dressing and ensuring pt not fidgeting with line. Issued gait belt to assist with safe mobility/stair mgmt at home. As spouse able to provide 24/7 assist, recommend HHOT at DC and a BSC.     If plan is discharge home, recommend the following:   A little help with walking and/or transfers;A lot of help with bathing/dressing/bathroom;Assistance with feeding;Direct supervision/assist for medications management;Supervision due to cognitive status;Help with stairs or ramp for entrance     Functional Status Assessment   Patient has had a recent decline in their functional status and demonstrates the ability to make significant improvements in function in a reasonable and predictable amount of time.     Equipment Recommendations   BSC/3in1     Recommendations for Other Services         Precautions/Restrictions   Precautions Precautions: Fall Restrictions Weight Bearing Restrictions Per Provider Order: No     Mobility Bed Mobility Overal bed mobility: Needs Assistance Bed Mobility: Supine to Sit     Supine to sit: Min assist, HOB elevated, Used rails     General bed mobility comments: handheld assist to lift trunk, assist to initiate LE to EOB    Transfers Overall transfer level:  Needs assistance Equipment used: None, 1 person hand held assist Transfers: Sit to/from Stand Sit to Stand: Contact guard assist           General transfer comment: from bedside, toilet and recliner      Balance Overall balance assessment: Needs assistance Sitting-balance support: No upper extremity supported, Feet supported Sitting balance-Leahy Scale: Fair     Standing balance support: No upper extremity supported, During functional activity Standing balance-Leahy Scale: Fair                             ADL either performed or assessed with clinical judgement   ADL Overall ADL's : Needs assistance/impaired Eating/Feeding: Supervision/ safety;Minimal assistance;Sitting   Grooming: Minimal assistance;Sitting   Upper Body Bathing: Moderate assistance;Sitting   Lower Body Bathing: Maximal assistance;Sit to/from stand;Sitting/lateral leans   Upper Body Dressing : Moderate assistance;Sitting   Lower Body Dressing: Maximal assistance;Sit to/from stand;Sitting/lateral leans   Toilet Transfer: Minimal assistance;Ambulation;Regular Teacher, Adult Education Details (indicate cue type and reason): handheld assist with gait belt. cues for turning to toilet Toileting- Clothing Manipulation and Hygiene: Maximal assistance;Sit to/from stand;Sitting/lateral lean Toileting - Clothing Manipulation Details (indicate cue type and reason): assist for hygiene     Functional mobility during ADLs: Minimal assistance General ADL Comments: Husband at bedside and hands on to assist. Educated on foley cath mgmt with depends and pants, gait belt placement and use. Discussion of BSC if unsure of walking pt to/from bathroom consistently, shower chair in the future vs continued to opt for sponge bathing.  Vision Baseline Vision/History: 1 Wears glasses Ability to See in Adequate Light: 0 Adequate Patient Visual Report: No change from baseline Vision Assessment?: No apparent visual  deficits     Perception         Praxis         Pertinent Vitals/Pain Pain Assessment Pain Assessment: No/denies pain     Extremity/Trunk Assessment Upper Extremity Assessment Upper Extremity Assessment: Right hand dominant;Generalized weakness   Lower Extremity Assessment Lower Extremity Assessment: Defer to PT evaluation   Cervical / Trunk Assessment Cervical / Trunk Assessment: Normal   Communication Communication Communication: No apparent difficulties   Cognition Arousal: Alert Behavior During Therapy: Flat affect, WFL for tasks assessed/performed, Restless Cognition: History of cognitive impairments             OT - Cognition Comments: hx of dementia. able to recall some PLOF accurately. Follows one step commands. does need cues/reminders to not fidget with catheter cord                 Following commands: Impaired Following commands impaired: Follows one step commands with increased time, Only follows one step commands consistently     Cueing  General Comments   Cueing Techniques: Verbal cues      Exercises     Shoulder Instructions      Home Living Family/patient expects to be discharged to:: Private residence Living Arrangements: Spouse/significant other Available Help at Discharge: Family;Available 24 hours/day Type of Home: Other(Comment) (townhouse) Home Access: Stairs to enter [employer] of Steps: 2-3   Home Layout: Two level;Other (Comment) (bedroom upstairs; bathroom on both floors) Alternate Level Stairs-Number of Steps: flight   Bathroom Shower/Tub: Chief Strategy Officer: Standard     Home Equipment: None          Prior Functioning/Environment Prior Level of Function : Needs assist             Mobility Comments: no AD for mobility ADLs Comments: Husband assists with LB dressing, showering tasks due to cognition. With weakness, husband opted to assist with sponge bathing rather than having  pt stand in shower.    OT Problem List: Decreased strength;Impaired balance (sitting and/or standing);Decreased activity tolerance;Decreased cognition;Decreased knowledge of use of DME or AE   OT Treatment/Interventions: Self-care/ADL training;Therapeutic exercise;Energy conservation;DME and/or AE instruction;Therapeutic activities;Patient/family education;Balance training      OT Goals(Current goals can be found in the care plan section)   Acute Rehab OT Goals Patient Stated Goal: be able to safely assist pt at home OT Goal Formulation: With patient/family Time For Goal Achievement: 06/13/24 Potential to Achieve Goals: Good ADL Goals Pt Will Transfer to Toilet: with supervision;ambulating Additional ADL Goal #1: Caregiver to demonstrate independent and safe mgmt of all ADLs   OT Frequency:  Min 2X/week    Co-evaluation              AM-PAC OT 6 Clicks Daily Activity     Outcome Measure Help from another person eating meals?: A Little Help from another person taking care of personal grooming?: A Little Help from another person toileting, which includes using toliet, bedpan, or urinal?: A Lot Help from another person bathing (including washing, rinsing, drying)?: A Lot Help from another person to put on and taking off regular upper body clothing?: A Lot Help from another person to put on and taking off regular lower body clothing?: A Lot 6 Click Score: 14   End of Session Equipment Utilized During Treatment: Gait  belt Nurse Communication: Mobility status;Other (comment) (new catheter sticker)  Activity Tolerance: Patient tolerated treatment well Patient left: in chair;with call bell/phone within reach;with chair alarm set  OT Visit Diagnosis: Unsteadiness on feet (R26.81);Other abnormalities of gait and mobility (R26.89);Other symptoms and signs involving cognitive function                Time: 9195-9162 OT Time Calculation (min): 33 min Charges:  OT General  Charges $OT Visit: 1 Visit OT Evaluation $OT Eval Moderate Complexity: 1 Mod OT Treatments $Self Care/Home Management : 8-22 mins  Lindsay Collier, OTR/L Acute Rehab Services Office: [phone]   Lindsay Collier 05/30/2024, 8:52 AM

## 2024-05-30 NOTE — Progress Notes (Addendum)
 A Bedside Commode is recommended for home use as pt has mobility deficits that impact ability to walk to bathroom safely for toileting tasks. A bedside commode will maximize safety with ADLs, decrease fall risk and lower risk of readmission.

## 2024-05-30 NOTE — Discharge Summary (Signed)
 "  Physician Discharge Summary  Lindsay Collier FMW:[id number] DOB: [birth year] DOA: 05/27/2024  PCP: Juliane Che, PA  Admit date: 05/27/2024 Discharge date: 05/30/2024  Admitted From: Home. Disposition: Home. Recommendations for Outpatient Follow-up:  Outpatient follow-up with PCP in 1 to 2 weeks Outpatient follow-up with urology for voiding trial in 1 week Check BP, CMP and CBC at follow-up Please follow up on the following pending results: None  Home Health: HHPT/OT/RN Equipment/Devices: Bedside commode  Discharge Condition: Stable CODE STATUS: DNR   Contact information for follow-up providers     Juliane Che, PA. Schedule an appointment as soon as possible for a visit in 1 week(s).   Specialty: Family Medicine Contact information: [address] [name] [address] [phone]         [facility] Follow up in 1 week(s).   Why: Voiding trial Contact information: [address] [place]  [address] [phone]        [facility] Follow up.   Why: This provider will deliver a Bedside Commode to your home after discharge. Contact information: [address] [place] [address] [phone]              Contact information for after-discharge care     Home Medical Care     [facility] .   Service: Home Health Services Why: This provider will reach out to you 24-48 hours after discharge to begin Home Health PT/OT/RN services. Contact information: [address] [place]  [address] [phone]                     Hospital course 68 year old F with PMH of severe dementia, HTN, anxiety, depression and prior tobacco use sent to ED by PCP due to hyponatremia and hypokalemia after she presented there with poor p.o. intake for about a week and nausea and vomiting for about 2 days.  Patient was admitted with  hyponatremia, hypokalemia, dehydration and generalized weakness in the setting of poor p.o. intake, nausea, vomiting and concurrent use of HCTZ.   In ED, stable vitals.  Na 117.  K2.6.  CT head without acute finding.  Received fluid bolus and started on IV potassium and admitted for further care.  Foley catheter placed due to urinary retention.  She had about 1.1 L urine output after Foley insertion.  The next day, hypokalemia resolved.  Hyponatremia improved.  Continued on IV fluid.  On the day of discharge, hyponatremia continued to improve off IV fluid.  K remained stable.  Lisinopril  and HCTZ discontinued.  Therapy recommended home health and DME.   Patient to follow-up with urology in 1 week for voiding trial  See individual problem list below for more.   Problems addressed during this hospitalization Hypovolemic hyponatremia: Likely due to poor p.o. intake, nausea, vomiting and concurrent use of thiazide diuretics and ACE inhibitors.  Improved with IV fluid and off IV fluid. Recent Labs  Lab 05/27/24 1023 05/27/24 1630 05/27/24 2122 05/28/24 0457 05/28/24 1442 05/29/24 0546 05/30/24 0542  NA 117* 123* 122* 124* 127* 131* 133*  - Discontinued lisinopril  and HCTZ on discharge. - Encourage oral hydration.    Hypokalemia: Due to thiazide diuretics.  Resolved.  HCTZ discontinued.   Severe dementia without behavioral disturbance: Awake and alert but only oriented to self and husband. -Reorientation and delirium precaution.   Generalized weakness: Independently ambulates at baseline. - HHPT/OT and bedside  commode ordered.   Essential hypertension: Normotensive off home lisinopril /HCTZ - Discontinue lisinopril /HCTZ. -Reassess BP at follow-up   Acute urinary retention: Unable to void in ED and Foley inserted with removal of 1.1 L.  - Continue Foley catheter at least for 1 week  - Outpatient follow-up with urology for voiding trial.   Possible constipation: Resolved.  Had  bowel movement. - Continue bowel regimen   Advance care planning: DNR.  See discussion on 12/28.   Poor p.o. intake/increase nutrient needs: Body mass index is 25.81 kg/m. Nutrition Problem: Increased nutrient needs Etiology: acute illness Signs/Symptoms: estimated needs Interventions: Ensure Enlive (each supplement provides 350kcal and 20 grams of protein), MVI     Consultations: None  Time spent 35  minutes  Vital signs Vitals:   05/29/24 0515 05/29/24 1245 05/29/24 1955 05/30/24 0531  BP: 100/71 117/75 121/77 106/71  Pulse: 65 70 79 68  Temp: 98 F (36.7 C) 98.2 F (36.8 C) 98.4 F (36.9 C) 98.2 F (36.8 C)  Resp: 18 12 18 18   Height:      Weight:      SpO2: 99% 97% 98% 91%  TempSrc: Oral Axillary Oral Oral  BMI (Calculated):         Discharge exam  GENERAL: No apparent distress.  Nontoxic. HEENT: MMM.  Vision and hearing grossly intact.  NECK: Supple.  No apparent JVD.  RESP:  No IWOB.  Fair aeration bilaterally. CVS:  RRR. Heart sounds normal.  ABD/GI/GU: BS+. Abd soft, NTND.  Foley catheter in place. MSK/EXT:  Moves extremities. No apparent deformity. No edema.  SKIN: no apparent skin lesion or wound NEURO: Awake and alert. Oriented to self and husband.  No apparent focal neuro deficit. PSYCH: Calm. Normal affect.   Discharge Instructions Discharge Instructions     Discharge instructions   Complete by: As directed    It has been a pleasure taking care of you! You were hospitalized due to low sodium and potassium level likely from your blood pressure medications.  Your sodium and potassium has improved with treatment.  We have stopped your blood pressure medication that is contributing.  Maintain good hydration. You also had urinary retention. Please call the urology office as soon as possible to schedule follow up in one week.   Follow-up with your primary care doctor in 1 to 2 weeks or sooner if needed.  Please review your new medication list and the  directions before you take your medications.  Take care,   Increase activity slowly   Complete by: As directed       Allergies as of 05/30/2024       Reactions   Codeine    Excessive sweating        Medication List     STOP taking these medications    ALPRAZolam  0.25 MG tablet Commonly known as: XANAX    lisinopril -hydrochlorothiazide  20-25 MG tablet Commonly known as: ZESTORETIC    OVER THE COUNTER MEDICATION   OVER THE COUNTER MEDICATION       TAKE these medications    atorvastatin  20 MG tablet Commonly known as: LIPITOR Take 20 mg by mouth at bedtime.   calcium  carbonate 1500 (600 Ca) MG Tabs tablet Commonly known as: OSCAL Take by mouth.   donepezil  10 MG tablet Commonly known as: ARICEPT  Take 1 tablet (10 mg total) by mouth at bedtime.   ferrous sulfate 325 (65 FE) MG tablet Take 325 mg by mouth daily with breakfast.   fish oil-omega-3 fatty acids 1000  MG capsule Take 2 g by mouth daily.   memantine  10 MG tablet Commonly known as: NAMENDA  Take 1 tablet (10 mg total) by mouth 2 (two) times daily.   multivitamin tablet Take 1 tablet by mouth daily.   OVER THE COUNTER MEDICATION Take 1,000 Units by mouth daily. Vit D-3 1000iu   PARoxetine  20 MG tablet Commonly known as: PAXIL  Take 20 mg by mouth daily. What changed: Another medication with the same name was removed. Continue taking this medication, and follow the directions you see here.   senna-docusate 8.6-50 MG tablet Commonly known as: Senokot-S Take 1-2 tablets by mouth 2 (two) times daily between meals as needed for mild constipation or moderate constipation.               Durable Medical Equipment  (From admission, onward)           Start     Ordered   05/30/24 0905  For home use only DME Bedside commode  Once       Question:  Patient needs a bedside commode to treat with the following condition  Answer:  Generalized weakness   05/30/24 0904              Procedures/Studies:   CT Head Wo Contrast Result Date: 05/27/2024 EXAM: CT HEAD WITHOUT CONTRAST 05/27/2024 11:24:31 AM TECHNIQUE: CT of the head was performed without the administration of intravenous contrast. Automated exposure control, iterative reconstruction, and/or weight based adjustment of the mA/kV was utilized to reduce the radiation dose to as low as reasonably achievable. COMPARISON: Brain MRI 02/26/2014, head CT 10/21/2010. CLINICAL HISTORY: 68 year old female with altered mental status, weakness, decreased mental status, and hyponatremia. FINDINGS: Motion artifact initially. Imaging was repeated with better diagnostic quality. BRAIN AND VENTRICLES: Progressed and severe brain atrophy since previous exams. Ex vacuo appearing ventricular enlargement with asymmetric and disproportionate atrophy of the frontal and parietal lobes worse on the left (series 4 image 19). Mesial temporal lobe structures appear relatively spared. Maintained gray white differentiation. No acute hemorrhage. No evidence of acute infarct. No extra-axial collection. No mass effect or midline shift. No suspicious intracranial vascular hyperdensity. Calcified atherosclerosis at the skull base. ORBITS: Postoperative changes to both globes since previous exams. SINUSES: Visible paranasal sinuses, tympanic cavities and mastoids are clear. SOFT TISSUES AND SKULL: No acute soft tissue abnormality. No skull fracture. IMPRESSION: 1. Severe progressive lobar cerebral atrophy since 2015 suggesting an advanced neurodegenerative disease. 2. No acute intracranial abnormality identified. Electronically signed by: Helayne Hurst MD 05/27/2024 11:45 AM EST RP Workstation: [id number]       The results of significant diagnostics from this hospitalization (including imaging, microbiology, ancillary and laboratory) are listed below for reference.     Microbiology: No results found for this or any previous visit (from the past 240  hours).   Labs:  CBC: Recent Labs  Lab 05/27/24 1023 05/27/24 1630 05/29/24 0546 05/30/24 0542  WBC 8.3 7.4 7.3 8.4  HGB 14.1 13.1 12.9 12.8  HCT 37.6 36.6 36.6 36.8  MCV 84.1 85.9 89.3 90.2  PLT 311 285 291 309   BMP &GFR Recent Labs  Lab 05/27/24 1023 05/27/24 1344 05/27/24 1630 05/27/24 2122 05/28/24 0457 05/28/24 1442 05/29/24 0546 05/30/24 0542  NA 117*  --    < > 122* 124* 127* 131* 133*  K 2.6*  --    < > 3.7 4.3 4.0 3.8 3.8  CL 75*  --    < > 86* 89* 90* 94* 95*  CO2 29  --    < > 29 26 28 28 29   GLUCOSE 115*  --    < > 95 99 88 87 81  BUN 7*  --    < > 5* 6* 6* <5* 7*  CREATININE 0.80  --    < > 0.58 0.67 0.69 0.73 0.68  CALCIUM  10.1  --    < > 9.5 9.5 9.7 9.6 9.7  MG 2.3  --   --   --   --   --  2.2 2.2  PHOS  --  2.7  --   --   --   --  2.6 2.7   < > = values in this interval not displayed.   Estimated Creatinine Clearance: 63 mL/min (by C-G formula based on SCr of 0.68 mg/dL). Liver & Pancreas: Recent Labs  Lab 05/27/24 1023 05/29/24 0546 05/30/24 0542  AST 23  --   --   ALT 17  --   --   ALKPHOS 208*  --   --   BILITOT 0.7  --   --   PROT 7.4  --   --   ALBUMIN 4.1 3.5 3.5   No results for input(s): LIPASE, AMYLASE in the last 168 hours. No results for input(s): AMMONIA in the last 168 hours. Diabetic: No results for input(s): HGBA1C in the last 72 hours. No results for input(s): GLUCAP in the last 168 hours. Cardiac Enzymes: No results for input(s): CKTOTAL, CKMB, CKMBINDEX, TROPONINI in the last 168 hours. No results for input(s): PROBNP in the last 8760 hours. Coagulation Profile: No results for input(s): INR, PROTIME in the last 168 hours. Thyroid  Function Tests: No results for input(s): TSH, T4TOTAL, FREET4, T3FREE, THYROIDAB in the last 72 hours. Lipid Profile: No results for input(s): CHOL, HDL, LDLCALC, TRIG, CHOLHDL, LDLDIRECT in the last 72 hours. Anemia Panel: No results for  input(s): VITAMINB12, FOLATE, FERRITIN, TIBC, IRON, RETICCTPCT in the last 72 hours. Urine analysis:    Component Value Date/Time   COLORURINE YELLOW 05/27/2024 1216   APPEARANCEUR CLEAR 05/27/2024 1216   APPEARANCEUR Clear 01/26/2018 0829   LABSPEC 1.015 05/27/2024 1216   PHURINE 7.0 05/27/2024 1216   GLUCOSEU NEGATIVE 05/27/2024 1216   HGBUR NEGATIVE 05/27/2024 1216   BILIRUBINUR NEGATIVE 05/27/2024 1216   BILIRUBINUR Negative 01/26/2018 0829   KETONESUR NEGATIVE 05/27/2024 1216   PROTEINUR TRACE (A) 05/27/2024 1216   UROBILINOGEN 0.2 11/20/2016 1132   NITRITE NEGATIVE 05/27/2024 1216   LEUKOCYTESUR NEGATIVE 05/27/2024 1216   Sepsis Labs: Invalid input(s): PROCALCITONIN, LACTICIDVEN   SIGNED:  Enis Riecke T Jalaysha Skilton, MD  [facility] 05/30/2024, 2:48 PM   "
